# Patient Record
Sex: Male | Born: 1940
Health system: Southern US, Community
[De-identification: ages and names within clinical notes are randomized; demographics above are authoritative.]

## PROBLEM LIST (undated history)

## (undated) DIAGNOSIS — N200 Calculus of kidney: Secondary | ICD-10-CM

## (undated) DIAGNOSIS — E669 Obesity, unspecified: Secondary | ICD-10-CM

## (undated) DIAGNOSIS — I484 Atypical atrial flutter: Secondary | ICD-10-CM

## (undated) DIAGNOSIS — E785 Hyperlipidemia, unspecified: Secondary | ICD-10-CM

## (undated) DIAGNOSIS — I442 Atrioventricular block, complete: Secondary | ICD-10-CM

## (undated) DIAGNOSIS — I119 Hypertensive heart disease without heart failure: Secondary | ICD-10-CM

## (undated) DIAGNOSIS — I38 Endocarditis, valve unspecified: Secondary | ICD-10-CM

## (undated) HISTORY — DX: Hypertensive heart disease without heart failure: I11.9

## (undated) HISTORY — DX: Atrioventricular block, complete: I44.2

## (undated) HISTORY — DX: Obesity, unspecified: E66.9

## (undated) HISTORY — DX: Hyperlipidemia, unspecified: E78.5

## (undated) HISTORY — PX: AORTIC VALVE REPLACEMENT: SHX41

## (undated) HISTORY — DX: Atypical atrial flutter: I48.4

## (undated) HISTORY — DX: Endocarditis, valve unspecified: I38

## (undated) HISTORY — DX: Calculus of kidney: N20.0

## (undated) HISTORY — PX: AORTIC ROOT REPLACEMENT: SHX1178

---

## 1998-11-22 ENCOUNTER — Ambulatory Visit (HOSPITAL_COMMUNITY): Admission: RE | Admit: 1998-11-22 | Discharge: 1998-11-23 | Payer: Self-pay | Admitting: Otolaryngology

## 2001-07-21 ENCOUNTER — Encounter (INDEPENDENT_AMBULATORY_CARE_PROVIDER_SITE_OTHER): Payer: Self-pay | Admitting: Specialist

## 2001-07-21 ENCOUNTER — Other Ambulatory Visit: Admission: RE | Admit: 2001-07-21 | Discharge: 2001-07-21 | Payer: Self-pay | Admitting: Urology

## 2003-02-16 ENCOUNTER — Encounter (INDEPENDENT_AMBULATORY_CARE_PROVIDER_SITE_OTHER): Payer: Self-pay | Admitting: Specialist

## 2003-02-16 ENCOUNTER — Ambulatory Visit (HOSPITAL_COMMUNITY): Admission: RE | Admit: 2003-02-16 | Discharge: 2003-02-16 | Payer: Self-pay | Admitting: Gastroenterology

## 2006-01-30 ENCOUNTER — Ambulatory Visit (HOSPITAL_COMMUNITY): Admission: RE | Admit: 2006-01-30 | Discharge: 2006-01-30 | Payer: Self-pay | Admitting: Orthopedic Surgery

## 2007-03-22 HISTORY — PX: INSERT / REPLACE / REMOVE PACEMAKER: SUR710

## 2007-03-28 ENCOUNTER — Inpatient Hospital Stay (HOSPITAL_BASED_OUTPATIENT_CLINIC_OR_DEPARTMENT_OTHER): Admission: RE | Admit: 2007-03-28 | Discharge: 2007-03-28 | Payer: Self-pay | Admitting: Cardiology

## 2007-03-28 HISTORY — PX: CARDIAC CATHETERIZATION: SHX172

## 2007-05-05 ENCOUNTER — Encounter (HOSPITAL_COMMUNITY): Admission: RE | Admit: 2007-05-05 | Discharge: 2007-08-03 | Payer: Self-pay | Admitting: Cardiology

## 2007-08-04 ENCOUNTER — Encounter (HOSPITAL_COMMUNITY): Admission: RE | Admit: 2007-08-04 | Discharge: 2007-08-21 | Payer: Self-pay | Admitting: Cardiology

## 2009-02-15 ENCOUNTER — Encounter: Admission: RE | Admit: 2009-02-15 | Discharge: 2009-02-15 | Payer: Self-pay | Admitting: Family Medicine

## 2009-10-23 ENCOUNTER — Ambulatory Visit (HOSPITAL_COMMUNITY): Admission: RE | Admit: 2009-10-23 | Discharge: 2009-10-23 | Payer: Self-pay | Admitting: Surgery

## 2009-10-23 ENCOUNTER — Encounter (INDEPENDENT_AMBULATORY_CARE_PROVIDER_SITE_OTHER): Payer: Self-pay | Admitting: Surgery

## 2010-05-07 ENCOUNTER — Encounter: Payer: Self-pay | Admitting: Internal Medicine

## 2010-08-16 ENCOUNTER — Ambulatory Visit: Payer: Self-pay | Admitting: Cardiology

## 2010-10-18 ENCOUNTER — Encounter: Payer: Self-pay | Admitting: Internal Medicine

## 2010-11-11 ENCOUNTER — Ambulatory Visit: Payer: Self-pay | Admitting: Cardiology

## 2010-11-19 ENCOUNTER — Ambulatory Visit: Payer: Self-pay | Admitting: Internal Medicine

## 2010-11-19 DIAGNOSIS — E785 Hyperlipidemia, unspecified: Secondary | ICD-10-CM

## 2010-11-19 DIAGNOSIS — K219 Gastro-esophageal reflux disease without esophagitis: Secondary | ICD-10-CM

## 2010-11-19 DIAGNOSIS — I442 Atrioventricular block, complete: Secondary | ICD-10-CM

## 2010-11-19 DIAGNOSIS — I10 Essential (primary) hypertension: Secondary | ICD-10-CM

## 2011-01-20 NOTE — Assessment & Plan Note (Signed)
Summary: pacer check/medtronic   Visit Type:  Initial Consult Referring Lenay Lovejoy:  Dr Deborah Chalk   History of Present Illness: Scott [name] is a pleasant 70 yo WM with a s/p aortic valve replacement with AV block postoperatively requiring PPM who presents today to establish EP care.  He reports doing very well recently.  He remains very active.  He denies symptoms of palpitations, chest pain, shortness of breath, orthopnea, PND, lower extremity edema, dizziness, presyncope, syncope, or neurologic sequela. The patient is tolerating medications without difficulties and is otherwise without complaint today.   Current Medications (verified): 1)  Lipitor 10 Mg Tabs (Atorvastatin Calcium) .... Take One Tablet By Mouth Daily. 2)  Metoprolol Tartrate 25 Mg Tabs (Metoprolol Tartrate) .... Take One Tablet By Mouth Twice A Day 3)  Vitamin D 2000 Unit Tabs (Cholecalciferol) .... Daily 4)  Fish Oil   Oil (Fish Oil) .... 2 Daily 5)  Timolol Maleate .... Uad 6)  Multivitamins   Tabs (Multiple Vitamin) .... Once Daily  Allergies (verified): 1)  ! Morphine 2)  ! Cleocin  Past History:  Past Medical History: S/p aortic valve replacement complete heart block requiring PPM (MDT) implant 2008 VASECTOMY, HX OF (ICD-V26.52) HYPERTENSION (ICD-401.9) GERD (ICD-530.81) HYPERLIPIDEMIA (ICD-272.4)    Past Surgical History: sp aortic valve replacement by DR Park Breed at [facility] 2008 PPM (MDT) implanted at [facility] 2008  Family History: Reviewed history from 11/19/2010 and no changes required. Parents deceased.  Father died at 61 with heart attack  and hypertension.  Mother died at 24 with Alzheimer's.  Social History: Reviewed history from 11/19/2010 and no changes required.  He is retired. He has had no tobacco products for over  20 some years.  He lives at home with his wife.  Review of Systems       All systems are reviewed and negative except as listed in the HPI.   Vital  Signs:  Patient profile:   70 year old male Height:      70 inches Weight:      220 pounds BMI:     31.68 Pulse rate:   62 / minute BP sitting:   142 / 78  (left arm)  Vitals Entered By: Laurance Flatten CMA (November 19, 2010 2:43 PM)  Physical Exam  General:  Well developed, well nourished, in no acute distress. Head:  normocephalic and atraumatic Eyes:  PERRLA/EOM intact; conjunctiva and lids normal. Mouth:  Teeth, gums and palate normal. Oral mucosa normal. Neck:  supple Chest Wall:  pacemaker pocket is well healed Lungs:  Clear bilaterally to auscultation and percussion. Heart:  Non-displaced PMI, chest non-tender; regular rate and rhythm, S1, S2 without murmurs, rubs or gallops. Carotid upstroke normal, no bruit. Normal abdominal aortic size, no bruits. Femorals normal pulses, no bruits. Pedals normal pulses. No edema, no varicosities. Abdomen:  Bowel sounds positive; abdomen soft and non-tender without masses, organomegaly, or hernias noted. No hepatosplenomegaly. Msk:  Back normal, normal gait. Muscle strength and tone normal. Pulses:  pulses normal in all 4 extremities Extremities:  No clubbing or cyanosis. Neurologic:  Alert and oriented x 3.   PPM Specifications Following MD:  Scott Range, MD     Referring MD:  Rolla Plate PPM Vendor:  Medtronic     PPM Model Number:  [id number]     PPM Serial Number:  [id number] PPM DOI:  04/13/2007     PPM Implanting MD:  Scott Rhody Tennant,md  Lead 1    Location: RA  DOI: 04/13/2007     Model #: 0981     Serial #: [id number]     Status: active Lead 2    Location: RV     DOI: 04/13/2007     Model #: [id number]     Serial #: [id number]     Status: active  Magnet Response Rate:  BOL 85 ERI 65  Indications:  Sick sinus syndrome   PPM Follow Up Battery Voltage:  2.77 V     Battery Est. Longevity:  6.5 yrs       PPM Device Measurements Atrium  Amplitude: 2.80 mV, Impedance: 494 ohms, Threshold: 0.50 V at 0.40 msec Right Ventricle   Amplitude: 11.20 mV, Impedance: 734 ohms, Threshold: 0.750 V at 0.40 msec  Episodes MS Episodes:  0     Ventricular High Rate:  0     Atrial Pacing:  9.7%     Ventricular Pacing:  7.3%  Parameters Mode:  DDD     Lower Rate Limit:  60     Upper Rate Limit:  130 Paced AV Delay:  200     Sensed AV Delay:  180 Next Remote Date:  02/19/2011     Next Cardiology Appt Due:  10/22/2011 Tech Comments:  NORMAL DEVICE FUNCTION.  NO EPISODES SINCE LAST CHECK. NO CHANGES MADE. CARELINK 02-19-11 AND ROV IN 12 MTHS W/JA. Vella Kohler  November 19, 2010 3:18 PM MD Comments:  agree  Impression & Recommendations:  Problem # 1:  AV BLOCK, COMPLETE (ICD-426.0)  normal pacemaker function as above  His updated medication list for this problem includes:    Metoprolol Tartrate 25 Mg Tabs (Metoprolol tartrate) .Marland Kitchen... Take one tablet by mouth twice a day  Problem # 2:  HYPERTENSION (ICD-401.9)  stable no changes  His updated medication list for this problem includes:    Metoprolol Tartrate 25 Mg Tabs (Metoprolol tartrate) .Marland Kitchen... Take one tablet by mouth twice a day  Problem # 3:  HYPERLIPIDEMIA (ICD-272.4)  no changes  His updated medication list for this problem includes:    Lipitor 10 Mg Tabs (Atorvastatin calcium) .Marland Kitchen... Take one tablet by mouth daily.  Patient Instructions: 1)  Your physician wants you to follow-up in: 12 months with Dr Jacquiline Doe will receive a reminder letter in the mail two months in advance. If you don't receive a letter, please call our office to schedule the follow-up appointment. 2)  [facility] transmission on 02/19/2011

## 2011-01-20 NOTE — Cardiovascular Report (Signed)
Summary: Office Visit   Office Visit   Imported By: Roderic Ovens 11/25/2010 10:41:19  _____________________________________________________________________  External Attachment:    Type:   Image     Comment:   External Document

## 2011-01-20 NOTE — Miscellaneous (Signed)
Summary: Device preload  Clinical Lists Changes  Observations: Added new observation of PPM INDICATN: Sick sinus syndrome (10/18/2010 11:28) Added new observation of MAGNET RTE: BOL 85 ERI 65 (10/18/2010 11:28) Added new observation of PPMLEADSTAT2: active (10/18/2010 11:28) Added new observation of PPMLEADSER2: [id number] (10/18/2010 11:28) Added new observation of PPMLEADMOD2: 4092  (10/18/2010 11:28) Added new observation of PPMLEADDOI2: 04/13/2007  (10/18/2010 11:28) Added new observation of PPMLEADLOC2: RV  (10/18/2010 11:28) Added new observation of PPMLEADSTAT1: active  (10/18/2010 11:28) Added new observation of PPMLEADSER1: [id number]  (10/18/2010 11:28) Added new observation of PPMLEADMOD1: 5076  (10/18/2010 11:28) Added new observation of PPMLEADDOI1: 04/13/2007  (10/18/2010 11:28) Added new observation of PPMLEADLOC1: RA  (10/18/2010 11:28) Added new observation of PPM DOI: 04/13/2007  (10/18/2010 11:28) Added new observation of PPM SERL#: [id number]  (10/18/2010 11:28) Added new observation of PPM MODL#: [id number]  (10/18/2010 86:57) Added new observation of PACEMAKERMFG: Medtronic  (10/18/2010 11:28) Added new observation of PPM IMP MD: Duffy Rhody Tennant,md  (10/18/2010 11:28) Added new observation of PPM REFER MD: Rolla Plate  (10/18/2010 11:28) Added new observation of PACEMAKER MD: Hillis Range, MD  (10/18/2010 11:28)      PPM Specifications Following MD:  Hillis Range, MD     Referring MD:  Rolla Plate PPM Vendor:  Medtronic     PPM Model Number:  vedr01     PPM Serial Number:  [id number] PPM DOI:  04/13/2007     PPM Implanting MD:  Rolla Plate  Lead 1    Location: RA     DOI: 04/13/2007     Model #: 8413     Serial #: [id number]     Status: active Lead 2    Location: RV     DOI: 04/13/2007     Model #: 3664     Serial #: [id number]     Status: active  Magnet Response Rate:  BOL 85 ERI 65  Indications:  Sick sinus syndrome

## 2011-01-22 NOTE — Letter (Signed)
Summary: [facility] Progress Note   [facility] Progress Note   Imported By: Roderic Ovens 12/09/2010 16:16:45  _____________________________________________________________________  External Attachment:    Type:   Image     Comment:   External Document

## 2011-02-09 ENCOUNTER — Other Ambulatory Visit: Payer: Self-pay | Admitting: Family Medicine

## 2011-02-09 DIAGNOSIS — R410 Disorientation, unspecified: Secondary | ICD-10-CM

## 2011-02-10 ENCOUNTER — Ambulatory Visit
Admission: RE | Admit: 2011-02-10 | Discharge: 2011-02-10 | Disposition: A | Discharge status: Home / Self Care | Payer: Medicare Other | Source: Ambulatory Visit | Attending: Family Medicine | Admitting: Family Medicine

## 2011-02-10 ENCOUNTER — Other Ambulatory Visit: Payer: Self-pay

## 2011-02-10 ENCOUNTER — Other Ambulatory Visit: Payer: Self-pay | Admitting: Family Medicine

## 2011-02-10 DIAGNOSIS — R410 Disorientation, unspecified: Secondary | ICD-10-CM

## 2011-02-10 MED ORDER — IOHEXOL 300 MG/ML  SOLN
75.0000 mL | Freq: Once | INTRAMUSCULAR | Status: AC | PRN
  Administered 2011-02-10: 75 mL via INTRAVENOUS

## 2011-02-19 ENCOUNTER — Encounter: Payer: Self-pay | Admitting: Internal Medicine

## 2011-02-19 ENCOUNTER — Encounter (INDEPENDENT_AMBULATORY_CARE_PROVIDER_SITE_OTHER): Payer: Medicare Other

## 2011-02-19 DIAGNOSIS — I495 Sick sinus syndrome: Secondary | ICD-10-CM

## 2011-03-09 ENCOUNTER — Encounter: Payer: Self-pay | Admitting: *Deleted

## 2011-03-19 NOTE — Letter (Signed)
Summary: Remote Device Check  [employer], Main Office  [address]   [place], [place] [address]   Phone: [phone]  Fax: [phone]     March 09, 2011 MRN: [id number]   [place] Cocco [address] [place], [place]  [address]   Dear Mr. MCMICHAEL,   Your remote transmission was recieved and reviewed by your physician.  All diagnostics were within normal limits for you.  __X___Your next transmission is scheduled for:  05-21-2011.  Please transmit at any time this day.  If you have a wireless device your transmission will be sent automatically.  Sincerely,  Vella Kohler

## 2011-03-19 NOTE — Cardiovascular Report (Signed)
Summary: Office Visit   Office Visit   Imported By: Roderic Ovens 03/10/2011 14:10:55  _____________________________________________________________________  External Attachment:    Type:   Image     Comment:   External Document

## 2011-03-26 LAB — BASIC METABOLIC PANEL
CO2: 29 mEq/L (ref 19–32)
Calcium: 9.4 mg/dL (ref 8.4–10.5)
Creatinine, Ser: 1.01 mg/dL (ref 0.4–1.5)
GFR calc Af Amer: >60 mL/min (ref >60)
Glucose, Bld: 91 mg/dL (ref 70–99)

## 2011-03-26 LAB — DIFFERENTIAL
Basophils Absolute: 0 10*3/uL (ref 0.0–0.1)
Basophils Relative: 1 % (ref 0–1)
Eosinophils Relative: 6 % — ABNORMAL HIGH (ref 0–5)
Lymphocytes Relative: 24 % (ref 12–46)
Monocytes Absolute: 0.9 10*3/uL (ref 0.1–1.0)
Neutrophils Relative %: 58 % (ref 43–77)

## 2011-03-26 LAB — CBC
MCHC: 34.5 g/dL (ref 30.0–36.0)
MCV: 91.6 fL (ref 78.0–100.0)
WBC: 7 10*3/uL (ref 4.0–10.5)

## 2011-04-20 ENCOUNTER — Other Ambulatory Visit: Payer: Self-pay | Admitting: *Deleted

## 2011-04-20 DIAGNOSIS — E7800 Pure hypercholesterolemia, unspecified: Secondary | ICD-10-CM

## 2011-05-06 ENCOUNTER — Encounter: Payer: Self-pay | Admitting: Cardiology

## 2011-05-06 DIAGNOSIS — I7090 Unspecified atherosclerosis: Secondary | ICD-10-CM | POA: Insufficient documentation

## 2011-05-06 DIAGNOSIS — E785 Hyperlipidemia, unspecified: Secondary | ICD-10-CM | POA: Insufficient documentation

## 2011-05-06 DIAGNOSIS — E669 Obesity, unspecified: Secondary | ICD-10-CM | POA: Insufficient documentation

## 2011-05-06 DIAGNOSIS — N200 Calculus of kidney: Secondary | ICD-10-CM | POA: Insufficient documentation

## 2011-05-07 ENCOUNTER — Ambulatory Visit (INDEPENDENT_AMBULATORY_CARE_PROVIDER_SITE_OTHER): Payer: Medicare Other | Admitting: Cardiology

## 2011-05-07 ENCOUNTER — Encounter: Payer: Self-pay | Admitting: Cardiology

## 2011-05-07 ENCOUNTER — Other Ambulatory Visit (INDEPENDENT_AMBULATORY_CARE_PROVIDER_SITE_OTHER): Payer: Medicare Other | Admitting: *Deleted

## 2011-05-07 DIAGNOSIS — Z954 Presence of other heart-valve replacement: Secondary | ICD-10-CM

## 2011-05-07 DIAGNOSIS — I7090 Unspecified atherosclerosis: Secondary | ICD-10-CM

## 2011-05-07 DIAGNOSIS — Z952 Presence of prosthetic heart valve: Secondary | ICD-10-CM | POA: Insufficient documentation

## 2011-05-07 DIAGNOSIS — E7800 Pure hypercholesterolemia, unspecified: Secondary | ICD-10-CM

## 2011-05-07 DIAGNOSIS — I442 Atrioventricular block, complete: Secondary | ICD-10-CM

## 2011-05-07 LAB — HEPATIC FUNCTION PANEL
Albumin: 3.5 g/dL (ref 3.5–5.2)
Total Protein: 6 g/dL (ref 6.0–8.3)

## 2011-05-07 LAB — LIPID PANEL
Cholesterol: 170 mg/dL (ref 0–200)
HDL: 39.2 mg/dL (ref >39.00)
LDL Cholesterol: 100 mg/dL — ABNORMAL HIGH (ref 0–99)
Total CHOL/HDL Ratio: 4
Triglycerides: 155 mg/dL — ABNORMAL HIGH (ref 0.0–149.0)
VLDL: 31 mg/dL (ref 0.0–40.0)

## 2011-05-07 LAB — BASIC METABOLIC PANEL
BUN: 14 mg/dL (ref 6–23)
Sodium: 140 mEq/L (ref 135–145)

## 2011-05-07 NOTE — Progress Notes (Signed)
Subjective:   Scott [name] is seen today for a followup visit. He's had an aortic valve replacement and root revision in April 2008 with a #27 mm Medtronic FreeStyle bioprosthetic valve. He had a Medtronic dual-chamber pacemaker in April 2008 because of AV block. He has a history of hypercholesterolemia. Other problems have included mild obesity and only mild atherosclerosis at the time of catheterization in 2008.  Overall, Scott [name] is doing well with no specific complaints.  Current Outpatient Prescriptions  Medication Sig Dispense Refill  . atorvastatin (LIPITOR) 10 MG tablet Take 10 mg by mouth daily.        . Cholecalciferol (VITAMIN D PO) Take 200 Units by mouth daily.        . DiphenhydrAMINE HCl, Sleep, (SIMPLY SLEEP PO) Take by mouth at bedtime. 2 AT NIGHT       . dorzolamide-timolol (COSOPT) 22.3-6.8 MG/ML ophthalmic solution Place 1 drop into the right eye daily.        . fish oil-omega-3 fatty acids 1000 MG capsule Take by mouth daily. 2 DAILY       . metoprolol tartrate (LOPRESSOR) 25 MG tablet Take 25 mg by mouth 2 (two) times daily.        . ranitidine (ZANTAC) 150 MG tablet Take 150 mg by mouth at bedtime.        Marland Kitchen augmented betamethasone dipropionate (DIPROLENE-AF) 0.05 % cream as directed.        Allergies  Allergen Reactions  . Clindamycin/Lincomycin   . Morphine     Patient Active Problem List  Diagnoses  . HYPERLIPIDEMIA  . HYPERTENSION  . AV BLOCK, COMPLETE  . GERD  . Hyperlipidemia  . Obesity  . Atherosclerosis  . Kidney stones    History  Smoking status  . Former Smoker -- 1.5 packs/day for 30 years  . Types: Cigarettes  . Quit date: 10/30/1984  Smokeless tobacco  . Never Used    History  Alcohol Use No    Family History  Problem Relation Age of Onset  . Heart attack Father     Review of Systems:   The patient denies any heat or cold intolerance.  No weight gain or weight loss.  The patient denies headaches or blurry vision.  There is no cough or  sputum production.  The patient denies dizziness.  There is no hematuria or hematochezia.  The patient denies any muscle aches or arthritis.  The patient denies any rash.  The patient denies frequent falling or instability.  There is no history of depression or anxiety.  All other systems were reviewed and are negative.   Physical Exam:   Weight is 2:15. Blood pressure 124/84 sitting, heart rate is 68.The head is normocephalic and atraumatic.  Pupils are equally round and reactive to light.  Sclerae nonicteric.  Conjunctiva is clear.  Oropharynx is unremarkable.  There's adequate oral airway.  Neck is supple there are no masses.  Thyroid is not enlarged.  There is no lymphadenopathy.  Lungs are clear.  Chest is symmetric.  Heart shows a regular rate and rhythm.  S1 and S2 are normal.  There is no murmur click or gallop.  Abdomen is soft normal bowel sounds.  There is no organomegaly.  Genital and rectal deferred.  Extremities are without edema.  Peripheral pulses are adequate.  Neurologically intact.  Full range of motion.  The patient is not depressed.  Skin is warm and dry.  Assessment / Plan:

## 2011-05-07 NOTE — Assessment & Plan Note (Signed)
He has a tissue aortic valve. Overall, he's been doing well. He was placed because of aortic stenosis in the revision of his aortic root at the time of valve replacement.aortic valve surgery was done by Dr. Nevada Crane at [facility] in 2008.

## 2011-05-07 NOTE — Assessment & Plan Note (Signed)
Pacemaker followup will be done at the pacer clinic. Apparently he had an exceptionally long wait with his first visit.

## 2011-05-07 NOTE — Assessment & Plan Note (Signed)
He only had mild atherosclerosis at the time of catheterization in 2008 before his aortic valve replacement.

## 2011-05-08 ENCOUNTER — Telehealth: Payer: Self-pay | Admitting: *Deleted

## 2011-05-08 NOTE — Telephone Encounter (Signed)
Pt notified of lab results and to continue to work on diet/ exercise.  Pt will have labs in six months with Dr. Catha Gosselin.  Labs faxed to Dr. Catha Gosselin.

## 2011-05-08 NOTE — Op Note (Signed)
   NAME:  Scott [name], Scott [name]                          ACCOUNT NO.:  [id number]   MEDICAL RECORD NO.:  [id number]                   PATIENT TYPE:  AMB   LOCATION:  ENDO                                 FACILITY:  [facility]   PHYSICIAN:  John C. Madilyn Fireman, M.D.                 DATE OF BIRTH:  November 03, 1941   DATE OF PROCEDURE:  02/16/2003  DATE OF DISCHARGE:                                 OPERATIVE REPORT   PROCEDURE:  Colonoscopy with polypectomy.   INDICATIONS FOR PROCEDURE:  Colon cancer screening in a 70 year old patient.   DESCRIPTION OF PROCEDURE:  The patient was placed in the left lateral  decubitus position then placed on the pulse monitor with continuous low flow  oxygen delivered by nasal cannula. He was sedated with 100 mcg IV fentanyl  and 8 mg IV Versed. The Olympus video colonoscope was inserted into the  rectum and advanced to the cecum, confirmed by transillumination at  McBurney's point and visualization of the ileocecal valve and appendiceal  orifice. The prep was excellent. The cecum appeared normal. Within the  ascending colon, there was seen an 8 mm sessile polyp fulgurated by hot  biopsy. Similar polyps were seen in the transverse and descending colon and  also hot biopsied. These areas were otherwise normal as was the sigmoid  colon, no diverticula were noted. The rectum appeared normal and retroflexed  view of the anus revealed no obvious internal hemorrhoids. The colonoscope  was then withdrawn and the patient returned to the recovery room in stable  condition. The patient tolerated the procedure well and there were no  immediate complications.   IMPRESSION:  Ascending, transverse and descending colon polyps.   PLAN:  Await histology to determine method and interval for future colon  screening.                                               John C. Madilyn Fireman, M.D.    JCH/MEDQ  D:  02/16/2003  T:  02/16/2003  Job:  [id number]   cc:   Caryn Bee L. Little, M.D.  [address]  [place]  [place] [address]  Fax: [phone]

## 2011-05-08 NOTE — Cardiovascular Report (Signed)
NAME:  WINFERD, WEASE NO.:  [id number]   MEDICAL RECORD NO.:  [id number]          PATIENT TYPE:  OIB   LOCATION:  1962                         FACILITY:  [facility]   PHYSICIAN:  Colleen Can. Deborah Chalk, M.D.DATE OF BIRTH:  [birth year]   DATE OF PROCEDURE:  03/28/2007  DATE OF DISCHARGE:                            CARDIAC CATHETERIZATION   PROCEDURES:  1. Right left heart catheterization with selective coronary      angiography.  2. Left ventricular angiography.  3. Aortic root angiography.   HISTORY:  Mr. Navarrete is a 70 year old male referred for evaluation of  aortic stenosis.  He has had progressive increase in his aortic valve  gradients over the last 5 years.  In 2003, he had mean aortic valve  gradient of 19 mmHg with a peak gradient of 34 mm.  In 2004, his peak  gradient was 43 mmHg.  In 2006 his peak gradient was 87 mmHg; and in  2007 his peak aortic valve gradient was 91 mmHg with a mean of 53 mmHg.  The aortic valve was severely calcified.  He is moderately active; and  it was felt this appropriate to proceed on with elective aortic valve  surgery at this point in time.   PROCEDURE:  Is left to right heart catheterization with selective  coronary angiography, left ventricular angiography with aortic root  angiography, type and site of  percutaneous right femoral artery,  percutaneous right femoral vein.   CATHETERS:  A 7-French Swan-Ganz thermodilution catheter, a 5-French, 4  curved Judkins right-and-left coronary catheters, 5-French pigtail  ventriculographic catheter.   CONTRAST:  Pure Omnipaque.   MEDICATIONS GIVEN PRIOR TO PROCEDURE:  Valium 10 mg.   MEDICATIONS GIVEN DURING PROCEDURE:  Versed 2 mg IV.   COMMENT:  The patient tolerated the procedure well.   HEMODYNAMIC DATA:  The right atrial pressure showed an A wave of 11, V  wave of 8, and mean of 6.  RV was 38/3-7.  PA was 36/13-23.  Pulmonary  capillary wedge pressure:  A wave of 12, V wave  of 10, mean of 8.  Aortic pressure was 130/77, LV was 173/10-16.  On pullback the aortic  pressure was 118/68, LV was 170/11-17.  The cardiac output was 5.1 by  Fick and 5.0 by thermodilution.  Fick cardiac index was 2.38 and  thermodilution cardiac output was 2.34.  Aortic valve area by Fick was  0.81 with aortic valve index of 0.38.   ANGIOGRAPHIC DATA:  The left ventricular angiogram was performed in RAO  position.  Overall cardiac size and silhouette were normal.  Global  function and regional wall motion were normal.   CORONARY ARTERIES:  1. LEFT MAIN CORONARY ARTERY:  Left main coronary artery had some mild      distal narrowing.  2. LEFT ANTERIOR DESCENDING:  Left anterior descending had      irregularities.  It ended on anterior wall.  There is a moderate-      sized diagonal in its midportion.  3. INTERMEDIATE CORONARY:  The intermediate coronary bifurcated early.      It  had irregularities, but otherwise was unremarkable.  4. LEFT CIRCUMFLEX:  The left circumflex is a small vessel.  It had      irregularities.  5. RIGHT CORONARY ARTERY:  The right coronary artery is a large      vessel.  It is calcified proximally.  It is mildly ectatic between      the acute margin and the crux.  It had no significant focal      disease.  6. AORTIC ROOT:  Demonstrated no significant aortic insufficiency.   OVERALL IMPRESSION:  1. Severe aortic stenosis.  2. Mild coronary atherosclerosis.   DISCUSSION:  In light of these findings and the progressive nature of  his gradient on echocardiogram as well as calcification, Merdith will be  referred for aortic valve replacement.      Colleen Can. Deborah Chalk, M.D.  Electronically Signed     SNT/MEDQ  D:  03/28/2007  T:  03/28/2007  Job:  [id number]

## 2011-05-08 NOTE — Telephone Encounter (Signed)
Message copied by Barnetta Hammersmith on Fri May 08, 2011  9:13 AM ------      Message from: Roger Shelter      Created: Fri May 08, 2011  8:53 AM       Ok, continue to work on diet/exercise.  Recheck labs in six months.

## 2011-05-08 NOTE — H&P (Signed)
NAME:  MONTREL, DONAHOE NO.:  [id number]   MEDICAL RECORD NO.:  [id number]           PATIENT TYPE:   LOCATION:                                 FACILITY:   PHYSICIAN:  Colleen Can. Deborah Chalk, M.D.DATE OF BIRTH:  13-Aug-1941   DATE OF ADMISSION:  DATE OF DISCHARGE:                              HISTORY & PHYSICAL   CHIEF COMPLAINT:  None.   HISTORY OF PRESENT ILLNESS:  Randell is a very pleasant 70 year old white  male who has known aortic stenosis.  He has been relatively  asymptomatic.  He has had progression of his aortic stenosis and is now  referred for elective cardiac catheterization.   PAST MEDICAL HISTORY.:  1. Severe asymptomatic aortic stenosis.  His most recent 2-D      echocardiogram showed an ejection fraction of 55-60% with aortic      valve area of 0.52 cm square,  mean gradient 53, peak gradient of      91.  2. Hyperlipidemia.  3. Gastroesophageal reflux disease.  4. Hypertension.  5. Previous left eye surgery.  He is blind in the left eye.  6. Previous history of oral surgery for sleep apnea in 2002.  7. Remote vasectomy.  8. Remote history of kidney stones.   ALLERGIES:  NONE.   CURRENT MEDICATIONS:  1. Lipitor 10 mg a day.  2. Pepcid 20 mg a day.  3. Baby aspirin daily.   FAMILY HISTORY:  Parents deceased.  Father died at 59 with heart attack  and hypertension.  Mother died at 66 with Alzheimer's.   SOCIAL HISTORY:  He is retired. He has had no tobacco products for over  20 some years.  He lives at home with his wife.   REVIEW OF SYSTEMS:  As noted above.  He remains moderately active.  He  has had no chest pain, shortness of breath or syncope.   PHYSICAL EXAMINATION:  On exam he is a pleasant white male in no acute  distress.  His weight 209, blood pressure is elevated at 160/100  sitting, 140/90 standing, heart rate 62 and regular, respirations 18.  He is afebrile.  SKIN:  Warm, dry.  Color is unremarkable.  LUNGS: Clear.  HEART:  Shows a harsh grade 3/6 outflow murmur. S2 is well-preserved.  There was no S3.  ABDOMEN: Soft.  EXTREMITIES: Without edema.  NEURO:  shows no gross focal deficits.   Pertinent labs are pending.   OVERALL IMPRESSION:  1. Severe asymptomatic aortic stenosis.  2. Hyperlipidemia.  3. Elevated blood pressure was known mild LVH.   PLAN:  Will proceed on with left and right heart catheterization.  Procedure has been reviewed in full detail and he is willing to proceed  on Monday March 28, 2007.      Sharlee Blew, N.P.      Colleen Can. Deborah Chalk, M.D.  Electronically Signed    LC/MEDQ  D:  03/25/2007  T:  03/25/2007  Job:  [id number]   cc:   Caryn Bee L. Little, M.D.

## 2011-05-21 ENCOUNTER — Encounter: Payer: Medicare Other | Admitting: *Deleted

## 2011-05-23 ENCOUNTER — Encounter: Payer: Self-pay | Admitting: *Deleted

## 2011-05-26 ENCOUNTER — Other Ambulatory Visit: Payer: Self-pay | Admitting: Internal Medicine

## 2011-05-26 ENCOUNTER — Ambulatory Visit (INDEPENDENT_AMBULATORY_CARE_PROVIDER_SITE_OTHER): Payer: Medicare Other | Admitting: *Deleted

## 2011-05-26 DIAGNOSIS — I442 Atrioventricular block, complete: Secondary | ICD-10-CM

## 2011-05-27 ENCOUNTER — Other Ambulatory Visit: Payer: Self-pay | Admitting: Dermatology

## 2011-06-03 NOTE — Progress Notes (Signed)
Pacer remote check  

## 2011-06-11 ENCOUNTER — Encounter: Payer: Self-pay | Admitting: *Deleted

## 2011-09-01 ENCOUNTER — Other Ambulatory Visit: Payer: Self-pay | Admitting: Dermatology

## 2011-09-09 ENCOUNTER — Telehealth: Payer: Self-pay | Admitting: Nurse Practitioner

## 2011-09-09 NOTE — Telephone Encounter (Signed)
Dentist office is calling to see if patient needs to pre-med before having a cleaning.  Gwen states the pt had a valve replacement in 2008 and was seen here for heart mumurs.  She states they forgot to contact us to check prior to his appt there which is at 10am today.  Please call office back to discuss.  Chart is in box.

## 2011-09-09 NOTE — Telephone Encounter (Signed)
Dr. Elder Cyphers office called wanted to know if Scott [name] needed to be pre medicated prior to dental app this AM. Per Lawson Fiscal does need SBE- has aortic valve. Gwen at Dr. Yetta Barre office said they did pre medicate him just in case.

## 2011-09-23 ENCOUNTER — Telehealth: Payer: Self-pay | Admitting: Internal Medicine

## 2011-09-23 NOTE — Telephone Encounter (Signed)
Walk In Pt Form " Pt Dropped off Refill Prescription" sent to Message Nurse  09/23/11/km

## 2011-09-24 ENCOUNTER — Other Ambulatory Visit: Payer: Self-pay | Admitting: *Deleted

## 2011-09-24 DIAGNOSIS — I10 Essential (primary) hypertension: Secondary | ICD-10-CM

## 2011-09-24 DIAGNOSIS — E7800 Pure hypercholesterolemia, unspecified: Secondary | ICD-10-CM

## 2011-09-24 MED ORDER — METOPROLOL TARTRATE 25 MG PO TABS: 25.0000 mg | ORAL_TABLET | Freq: Two times a day (BID) | ORAL | Status: DC

## 2011-09-24 MED ORDER — ATORVASTATIN CALCIUM 10 MG PO TABS: 10.0000 mg | ORAL_TABLET | Freq: Every day | ORAL | Status: DC

## 2011-09-28 ENCOUNTER — Telehealth: Payer: Self-pay | Admitting: Internal Medicine

## 2011-09-28 NOTE — Telephone Encounter (Signed)
Pt  Called. Pt said he brought in a refill request for metoprolol  180 tablets and atorvastatin  90 tablets for  [facility] and it was called into [facility] by mistake. Please call asap. He doesn't know what to do now

## 2011-09-28 NOTE — Telephone Encounter (Signed)
Pt called back and I let him know that I sent in his Rx's to [facility] on 09/24/11

## 2011-11-30 ENCOUNTER — Encounter: Payer: Self-pay | Admitting: Internal Medicine

## 2011-11-30 ENCOUNTER — Ambulatory Visit (INDEPENDENT_AMBULATORY_CARE_PROVIDER_SITE_OTHER): Payer: Medicare Other | Admitting: Internal Medicine

## 2011-11-30 DIAGNOSIS — R5383 Other fatigue: Secondary | ICD-10-CM

## 2011-11-30 DIAGNOSIS — I442 Atrioventricular block, complete: Secondary | ICD-10-CM

## 2011-11-30 DIAGNOSIS — Z952 Presence of prosthetic heart valve: Secondary | ICD-10-CM

## 2011-11-30 DIAGNOSIS — I359 Nonrheumatic aortic valve disorder, unspecified: Secondary | ICD-10-CM

## 2011-11-30 DIAGNOSIS — R5381 Other malaise: Secondary | ICD-10-CM

## 2011-11-30 DIAGNOSIS — I10 Essential (primary) hypertension: Secondary | ICD-10-CM

## 2011-11-30 LAB — PACEMAKER DEVICE OBSERVATION
AL AMPLITUDE: 2.8 mv
BAMS-0001: 175 {beats}/min
RV LEAD AMPLITUDE: 15.68 mv
RV LEAD IMPEDENCE PM: 827 Ohm
RV LEAD THRESHOLD: 0.5 V
VENTRICULAR PACING PM: 17

## 2011-11-30 NOTE — Assessment & Plan Note (Signed)
Stable No change required today  

## 2011-11-30 NOTE — Assessment & Plan Note (Signed)
He is scheduled to see Dr Shirlee Latch

## 2011-11-30 NOTE — Patient Instructions (Signed)
Your physician has recommended that you have a sleep study. This test records several body functions during sleep, including: brain activity, eye movement, oxygen and carbon dioxide blood levels, heart rate and rhythm, breathing rate and rhythm, the flow of air through your mouth and nose, snoring, body muscle movements, and chest and belly movement.  Your physician wants you to follow-up in: 1 year with Dr. Johney Frame.   You will receive a reminder letter in the mail two months in advance. If you don't receive a letter, please call our office to schedule the follow-up appointment.   Your physician recommends that you continue on your current medications as directed. Please refer to the Current Medication list given to you today.

## 2011-11-30 NOTE — Progress Notes (Signed)
PCP:  Mickie Hillier, MD  The patient presents today for routine electrophysiology followup.  Since last being seen in our clinic, the patient reports doing very well.  Today, he denies symptoms of palpitations, chest pain, shortness of breath, orthopnea, PND, lower extremity edema, dizziness, presyncope, syncope, or neurologic sequela.  The patient feels that he is tolerating medications without difficulties and is otherwise without complaint today.   Past Medical History  Diagnosis Date  . Hyperlipidemia   . Obesity   . Atherosclerosis   . Kidney stones   . Complete heart block     s/p PPM following AVR   Past Surgical History  Procedure Date  . Cardiac catheterization 03/28/2007    OVERALL CARDIAC SIZE AND SILHOUETTE WERE NORMAL. GLOBAL FUNCTION AND REGIONAL WALL WERE NORMAL  . Aortic valve replacement   . Aortic root replacement   . Insert / replace / remove pacemaker 03/2007    Current Outpatient Prescriptions  Medication Sig Dispense Refill  . atorvastatin (LIPITOR) 10 MG tablet Take 1 tablet (10 mg total) by mouth daily.  90 tablet  3  . augmented betamethasone dipropionate (DIPROLENE-AF) 0.05 % cream as directed.      . Cholecalciferol (VITAMIN D PO) Take 200 Units by mouth daily.        . DiphenhydrAMINE HCl, Sleep, (SIMPLY SLEEP PO) Take by mouth at bedtime. 2 AT NIGHT       . dorzolamide-timolol (COSOPT) 22.3-6.8 MG/ML ophthalmic solution Place 1 drop into the right eye daily.        . fish oil-omega-3 fatty acids 1000 MG capsule Take by mouth daily. 2 DAILY       . metoprolol tartrate (LOPRESSOR) 25 MG tablet Take 1 tablet (25 mg total) by mouth 2 (two) times daily.  180 tablet  3  . ranitidine (ZANTAC) 150 MG tablet Take 150 mg by mouth at bedtime.        . timolol (TIMOPTIC) 0.5 % ophthalmic solution as directed.        Allergies  Allergen Reactions  . Clindamycin/Lincomycin   . Morphine     History   Social History  . Marital Status: Married    Spouse  Name: N/A    Number of Children: N/A  . Years of Education: N/A   Occupational History  . Not on file.   Social History Main Topics  . Smoking status: Former Smoker -- 1.5 packs/day for 30 years    Types: Cigarettes    Quit date: 10/30/1984  . Smokeless tobacco: Never Used  . Alcohol Use: No  . Drug Use: No  . Sexually Active: Not on file   Other Topics Concern  . Not on file   Social History Narrative  . No narrative on file    Family History  Problem Relation Age of Onset  . Heart attack Father     ROS-  All systems are reviewed and are negative except as outlined in the HPI above   Physical Exam: Filed Vitals:   11/30/11 1013  BP: 124/68  Pulse: 64  Height: 5\' 10"  (1.778 m)  Weight: 216 lb (97.977 kg)    GEN- The patient is well appearing, alert and oriented x 3 today.   Head- normocephalic, atraumatic Eyes-  Sclera clear, conjunctiva pink Ears- hearing intact Oropharynx- clear Neck- supple, no JVP Lymph- no cervical lymphadenopathy Lungs- Clear to ausculation bilaterally, normal work of breathing Chest- pacemaker pocket is well healed Heart- Regular rate and rhythm, no murmurs, rubs  or gallops, PMI not laterally displaced GI- soft, NT, ND, + BS Extremities- no clubbing, cyanosis, or edema  Pacemaker interrogation- reviewed in detail today,  See PACEART report  Assessment and Plan:

## 2011-11-30 NOTE — Assessment & Plan Note (Signed)
Normal pacemaker function See Pace Art report No changes today  

## 2011-12-23 DIAGNOSIS — E291 Testicular hypofunction: Secondary | ICD-10-CM | POA: Diagnosis not present

## 2012-01-04 DIAGNOSIS — E291 Testicular hypofunction: Secondary | ICD-10-CM | POA: Diagnosis not present

## 2012-01-14 ENCOUNTER — Encounter: Payer: Self-pay | Admitting: Cardiology

## 2012-01-14 ENCOUNTER — Encounter (HOSPITAL_BASED_OUTPATIENT_CLINIC_OR_DEPARTMENT_OTHER): Payer: Medicare Other

## 2012-01-14 ENCOUNTER — Ambulatory Visit (INDEPENDENT_AMBULATORY_CARE_PROVIDER_SITE_OTHER): Payer: Medicare Other | Admitting: Cardiology

## 2012-01-14 DIAGNOSIS — I359 Nonrheumatic aortic valve disorder, unspecified: Secondary | ICD-10-CM

## 2012-01-14 DIAGNOSIS — Z954 Presence of other heart-valve replacement: Secondary | ICD-10-CM | POA: Diagnosis not present

## 2012-01-14 DIAGNOSIS — G4730 Sleep apnea, unspecified: Secondary | ICD-10-CM | POA: Diagnosis not present

## 2012-01-14 DIAGNOSIS — I442 Atrioventricular block, complete: Secondary | ICD-10-CM

## 2012-01-14 DIAGNOSIS — E785 Hyperlipidemia, unspecified: Secondary | ICD-10-CM

## 2012-01-14 DIAGNOSIS — I7090 Unspecified atherosclerosis: Secondary | ICD-10-CM

## 2012-01-14 DIAGNOSIS — Z952 Presence of prosthetic heart valve: Secondary | ICD-10-CM

## 2012-01-14 DIAGNOSIS — G4733 Obstructive sleep apnea (adult) (pediatric): Secondary | ICD-10-CM

## 2012-01-14 LAB — BASIC METABOLIC PANEL
BUN: 13 mg/dL (ref 6–23)
CO2: 27 mEq/L (ref 19–32)
Calcium: 9 mg/dL (ref 8.4–10.5)
GFR: 70.49 mL/min (ref >60.00)
Glucose, Bld: 101 mg/dL — ABNORMAL HIGH (ref 70–99)

## 2012-01-14 LAB — HEPATIC FUNCTION PANEL
ALT: 25 U/L (ref 0–53)
AST: 22 U/L (ref 0–37)
Alkaline Phosphatase: 70 U/L (ref 39–117)
Bilirubin, Direct: 0.1 mg/dL (ref 0.0–0.3)
Total Bilirubin: 0.9 mg/dL (ref 0.3–1.2)

## 2012-01-14 LAB — LIPID PANEL: Total CHOL/HDL Ratio: 4

## 2012-01-14 NOTE — Assessment & Plan Note (Signed)
Has Medtronic dual chamber PCM with regular followup.  He is not pacing today.

## 2012-01-14 NOTE — Patient Instructions (Signed)
Take aspirin 81mg  daily.  Your physician recommends that you have  lab work today--lipid profile/liver profile/BMET   Your physician has requested that you have an echocardiogram. Echocardiography is a painless test that uses sound waves to create images of your heart. It provides your doctor with information about the size and shape of your heart and how well your heart's chambers and valves are working. This procedure takes approximately one hour. There are no restrictions for this procedure.  Your physician has recommended that you have a sleep study. This test records several body functions during sleep, including: brain activity, eye movement, oxygen and carbon dioxide blood levels, heart rate and rhythm, breathing rate and rhythm, the flow of air through your mouth and nose, snoring, body muscle movements, and chest and belly movement.  Your physician wants you to follow-up in: 6 months with Dr Shirlee Latch. (July 2013) You will receive a reminder letter in the mail two months in advance. If you don't receive a letter, please call our office to schedule the follow-up appointment.

## 2012-01-14 NOTE — Assessment & Plan Note (Signed)
History of OSA, had uvuloplasty in the past.  Still with OSA symptoms and not on CPAP.  Will arrange sleep study.

## 2012-01-14 NOTE — Progress Notes (Signed)
PCP: Dr. Caryn Bee LIttle  71 yo with history of aortic valve replacement (bioprosthetic valve) and aortic root revision in 4/08 presents for cardiology followup.  He has been seen by Dr. Deborah Chalk in the past and is seen by me for the first time today.  He had a Medtronic dual chamber PCM placed post-operatively in 4/08 for complete heart block. He is not paced on ECG today.  He has done well since his surgery.  No chest pain.  He walks for exercise and denies exertional dyspnea.  He is not taking aspirin.  He also reports significant daytime sleepiness and snoring.  He had a uvuloplasty over 10 years ago.   ECG: NSR, RBBB  PMH: 1. OSA: s/p uvuloplasty > 10 years ago 2. S/p post bioprosthetic aortic valve replacement with #27 Medtronic FreeStyle valve in 4/08.  Also had aortic root revision.  3. Post-op complete heart block with Medtronic dual chamber PCM (4/08). 4. Hyperlipidemia 5. GERD 6. Cath pre-op in 2008 with mild atherosclerosis.  7. HTN  SH: Lives in [place], married, retired Careers adviser.  Quit smoking in 1985.    FH: Father with MI in his late 61s.  ROS: All systems reviewed and negative except as per HPI.   Current Outpatient Prescriptions  Medication Sig Dispense Refill  . atorvastatin (LIPITOR) 10 MG tablet Take 1 tablet (10 mg total) by mouth daily.  90 tablet  3  . augmented betamethasone dipropionate (DIPROLENE-AF) 0.05 % cream as directed.      . Cholecalciferol (VITAMIN D PO) Take 200 Units by mouth daily.        . DiphenhydrAMINE HCl, Sleep, (SIMPLY SLEEP PO) Take by mouth at bedtime. 2 AT NIGHT       . fish oil-omega-3 fatty acids 1000 MG capsule Take by mouth daily. 2 DAILY       . metoprolol tartrate (LOPRESSOR) 25 MG tablet Take 1 tablet (25 mg total) by mouth 2 (two) times daily.  180 tablet  3  . ranitidine (ZANTAC) 150 MG tablet Take 150 mg by mouth at bedtime.        . timolol (TIMOPTIC) 0.5 % ophthalmic solution as directed.      Marland Kitchen aspirin EC 81 MG tablet Take  1 tablet (81 mg total) by mouth daily.        BP 130/74  Pulse 68  Ht 5\' 10"  (1.778 m)  Wt 98.884 kg (218 lb)  BMI 31.28 kg/m2 General: NAD, overweight Neck: Thick, no JVD, no thyromegaly or thyroid nodule.  Lungs: Clear to auscultation bilaterally with normal respiratory effort. CV: Nondisplaced PMI.  Heart regular S1/S2, no S3/S4, no murmur.  No peripheral edema.  No carotid bruit.  Normal pedal pulses.  Abdomen: Soft, nontender, no hepatosplenomegaly, no distention.  Neurologic: Alert and oriented x 3.  Psych: Normal affect. Extremities: No clubbing or cyanosis.

## 2012-01-14 NOTE — Assessment & Plan Note (Signed)
Bioprosthetic aortic valve with aortic root revision in 4/08.  He is doing well symptomatically.  He needs antibiotics with dental work.  No imaging of valve in several years, so will arrange echo to assess valve function.  He needs to start ASA 81 mg daily.

## 2012-01-14 NOTE — Assessment & Plan Note (Signed)
No recent lipids.  He is fasting, will check today.

## 2012-01-20 DIAGNOSIS — E291 Testicular hypofunction: Secondary | ICD-10-CM | POA: Diagnosis not present

## 2012-01-21 ENCOUNTER — Ambulatory Visit (HOSPITAL_COMMUNITY): Payer: Medicare Other | Attending: Cardiovascular Disease

## 2012-01-21 DIAGNOSIS — I359 Nonrheumatic aortic valve disorder, unspecified: Secondary | ICD-10-CM | POA: Diagnosis not present

## 2012-01-21 DIAGNOSIS — Z954 Presence of other heart-valve replacement: Secondary | ICD-10-CM | POA: Diagnosis not present

## 2012-01-21 DIAGNOSIS — E785 Hyperlipidemia, unspecified: Secondary | ICD-10-CM | POA: Diagnosis not present

## 2012-01-25 ENCOUNTER — Telehealth: Payer: Self-pay | Admitting: Cardiology

## 2012-01-25 NOTE — Telephone Encounter (Signed)
Talked with pt about recent echo results 

## 2012-01-25 NOTE — Telephone Encounter (Signed)
Fu call °Patient returning your call °

## 2012-01-25 NOTE — Telephone Encounter (Signed)
Fu call °Patient returning your call again °

## 2012-01-25 NOTE — Telephone Encounter (Signed)
LMTCB

## 2012-02-01 ENCOUNTER — Ambulatory Visit (HOSPITAL_BASED_OUTPATIENT_CLINIC_OR_DEPARTMENT_OTHER): Payer: Medicare Other

## 2012-02-15 DIAGNOSIS — E291 Testicular hypofunction: Secondary | ICD-10-CM | POA: Diagnosis not present

## 2012-02-18 DIAGNOSIS — N400 Enlarged prostate without lower urinary tract symptoms: Secondary | ICD-10-CM | POA: Diagnosis not present

## 2012-02-18 DIAGNOSIS — E291 Testicular hypofunction: Secondary | ICD-10-CM | POA: Diagnosis not present

## 2012-03-01 DIAGNOSIS — L821 Other seborrheic keratosis: Secondary | ICD-10-CM | POA: Diagnosis not present

## 2012-03-01 DIAGNOSIS — L408 Other psoriasis: Secondary | ICD-10-CM | POA: Diagnosis not present

## 2012-03-01 DIAGNOSIS — L570 Actinic keratosis: Secondary | ICD-10-CM | POA: Diagnosis not present

## 2012-03-02 DIAGNOSIS — E291 Testicular hypofunction: Secondary | ICD-10-CM | POA: Diagnosis not present

## 2012-03-03 ENCOUNTER — Encounter: Payer: Medicare Other | Admitting: *Deleted

## 2012-03-07 ENCOUNTER — Encounter: Payer: Self-pay | Admitting: *Deleted

## 2012-03-11 ENCOUNTER — Ambulatory Visit (INDEPENDENT_AMBULATORY_CARE_PROVIDER_SITE_OTHER): Payer: Medicare Other | Admitting: *Deleted

## 2012-03-11 DIAGNOSIS — I442 Atrioventricular block, complete: Secondary | ICD-10-CM

## 2012-03-12 ENCOUNTER — Encounter: Payer: Self-pay | Admitting: Internal Medicine

## 2012-03-14 LAB — REMOTE PACEMAKER DEVICE
AL AMPLITUDE: 2.8 mv
ATRIAL PACING PM: 14
BAMS-0001: 175 {beats}/min
BATTERY VOLTAGE: 2.76 V
RV LEAD IMPEDENCE PM: 861 Ohm
VENTRICULAR PACING PM: 24

## 2012-03-17 DIAGNOSIS — H409 Unspecified glaucoma: Secondary | ICD-10-CM | POA: Diagnosis not present

## 2012-03-17 DIAGNOSIS — H4011X0 Primary open-angle glaucoma, stage unspecified: Secondary | ICD-10-CM | POA: Diagnosis not present

## 2012-03-21 ENCOUNTER — Encounter: Payer: Self-pay | Admitting: *Deleted

## 2012-03-22 NOTE — Progress Notes (Signed)
Remote pacer check  

## 2012-03-23 DIAGNOSIS — E291 Testicular hypofunction: Secondary | ICD-10-CM | POA: Diagnosis not present

## 2012-04-20 DIAGNOSIS — E291 Testicular hypofunction: Secondary | ICD-10-CM | POA: Diagnosis not present

## 2012-05-18 DIAGNOSIS — E291 Testicular hypofunction: Secondary | ICD-10-CM | POA: Diagnosis not present

## 2012-06-15 DIAGNOSIS — E291 Testicular hypofunction: Secondary | ICD-10-CM | POA: Diagnosis not present

## 2012-06-16 ENCOUNTER — Encounter: Payer: Medicare Other | Admitting: *Deleted

## 2012-06-20 ENCOUNTER — Encounter: Payer: Self-pay | Admitting: *Deleted

## 2012-06-25 ENCOUNTER — Encounter: Payer: Self-pay | Admitting: Internal Medicine

## 2012-06-27 ENCOUNTER — Ambulatory Visit (INDEPENDENT_AMBULATORY_CARE_PROVIDER_SITE_OTHER): Payer: Medicare Other | Admitting: *Deleted

## 2012-06-27 DIAGNOSIS — I442 Atrioventricular block, complete: Secondary | ICD-10-CM | POA: Diagnosis not present

## 2012-06-27 LAB — REMOTE PACEMAKER DEVICE
AL IMPEDENCE PM: 527 Ohm
AL THRESHOLD: 0.875 V
ATRIAL PACING PM: 18
RV LEAD AMPLITUDE: 11.2 mv
RV LEAD IMPEDENCE PM: 775 Ohm
RV LEAD THRESHOLD: 0.75 V

## 2012-06-30 ENCOUNTER — Encounter: Payer: Self-pay | Admitting: Cardiology

## 2012-06-30 ENCOUNTER — Ambulatory Visit (INDEPENDENT_AMBULATORY_CARE_PROVIDER_SITE_OTHER): Payer: Medicare Other | Admitting: Cardiology

## 2012-06-30 VITALS — BP 126/72 | HR 60 | Ht 70.0 in | Wt 217.0 lb

## 2012-06-30 DIAGNOSIS — E785 Hyperlipidemia, unspecified: Secondary | ICD-10-CM | POA: Diagnosis not present

## 2012-06-30 DIAGNOSIS — I359 Nonrheumatic aortic valve disorder, unspecified: Secondary | ICD-10-CM | POA: Diagnosis not present

## 2012-06-30 DIAGNOSIS — Z954 Presence of other heart-valve replacement: Secondary | ICD-10-CM

## 2012-06-30 DIAGNOSIS — I442 Atrioventricular block, complete: Secondary | ICD-10-CM | POA: Diagnosis not present

## 2012-06-30 DIAGNOSIS — Z952 Presence of prosthetic heart valve: Secondary | ICD-10-CM

## 2012-06-30 NOTE — Patient Instructions (Addendum)
Your physician recommends that you return for lab work on January 7,2014--fasting lipid profile/BMET. You should not eat or drink after midnight the night before the lab. The lab opens at 8:30am.   Your physician wants you to follow-up in: 1 year with Dr Shirlee Latch. (July 2014). You will receive a reminder letter in the mail two months in advance. If you don't receive a letter, please call our office to schedule the follow-up appointment.

## 2012-06-30 NOTE — Assessment & Plan Note (Signed)
Valve stable on last echo.  Needs antibiotic prophylaxis with dental work.

## 2012-06-30 NOTE — Assessment & Plan Note (Signed)
Has Medtronic dual chamber PCM with regular followup.

## 2012-06-30 NOTE — Assessment & Plan Note (Signed)
Lipids in 1/13 acceptable.  No h/o CAD.  Repeat in 1/14.

## 2012-06-30 NOTE — Progress Notes (Signed)
Patient ID: MUZAMMIL BRUINS, male   DOB: [birth year], 71 y.o.   MRN: [id number] PCP: Dr. Caryn Bee LIttle  71 yo with history of aortic valve replacement (bioprosthetic valve) and aortic root revision in 4/08 presents for cardiology followup.  He had a Medtronic dual chamber PCM placed post-operatively in 4/08 for complete heart block.  Echo in 1/13 showed normal EF and well-seated bioprosthetic AoV with mild AI.  No chest pain.  He walks for exercise and denies exertional dyspnea. He did not go for sleep study but also does not have daytime sleepiness (snores loudly).  Weight is down 1 lb.    Labs (1/13): LDL 86, HDL 42, LFTs normal, K 4.4, creatinine 1.1  ECG: AV sequential pacing  PMH: 1. OSA: s/p uvuloplasty > 10 years ago 2. S/p post bioprosthetic aortic valve replacement with #27 Medtronic FreeStyle valve in 4/08.  Also had aortic root revision.  Echo (1/13) with EF 50-55%, mild LVH, moderate diastolic dysfunction, bioprosthetic aortic valve with mean gradient 6 mmHg and mild AI, PA systolic pressure 37 mmHg.  3. Post-op complete heart block with Medtronic dual chamber PCM (4/08). 4. Hyperlipidemia 5. GERD 6. Cath pre-op in 2008 with mild atherosclerosis.  7. HTN  SH: Lives in [place], married, retired Careers adviser.  Quit smoking in 1985.    FH: Father with MI in his late 62s.  ROS: All systems reviewed and negative except as per HPI.   Current Outpatient Prescriptions  Medication Sig Dispense Refill  . aspirin EC 81 MG tablet Take 1 tablet (81 mg total) by mouth daily.      Marland Kitchen atorvastatin (LIPITOR) 10 MG tablet Take 1 tablet (10 mg total) by mouth daily.  90 tablet  3  . augmented betamethasone dipropionate (DIPROLENE-AF) 0.05 % cream as directed.      . Cholecalciferol (VITAMIN D PO) Take 200 Units by mouth daily.        . DiphenhydrAMINE HCl, Sleep, (SIMPLY SLEEP PO) Take by mouth at bedtime. 2 AT NIGHT       . fish oil-omega-3 fatty acids 1000 MG capsule Take by mouth daily. 2  DAILY       . metoprolol tartrate (LOPRESSOR) 25 MG tablet Take 1 tablet (25 mg total) by mouth 2 (two) times daily.  180 tablet  3  . ranitidine (ZANTAC) 150 MG tablet Take 150 mg by mouth at bedtime.        . timolol (TIMOPTIC) 0.5 % ophthalmic solution as directed.      . halobetasol (ULTRAVATE) 0.05 % ointment As needed      . testosterone cypionate (DEPOTESTOTERONE CYPIONATE) 200 MG/ML injection 1 shot once a month        BP 126/72  Pulse 60  Ht 5\' 10"  (1.778 m)  Wt 98.431 kg (217 lb)  BMI 31.14 kg/m2 General: NAD, overweight Neck: Thick, no JVD, no thyromegaly or thyroid nodule.  Lungs: Clear to auscultation bilaterally with normal respiratory effort. CV: Nondisplaced PMI.  Heart regular S1/S2, no S3/S4, no murmur.  1+ ankle edema.  No carotid bruit.  Normal pedal pulses.  Abdomen: Soft, nontender, no hepatosplenomegaly, no distention.  Neurologic: Alert and oriented x 3.  Psych: Normal affect. Extremities: No clubbing or cyanosis.

## 2012-07-04 ENCOUNTER — Encounter: Payer: Self-pay | Admitting: *Deleted

## 2012-07-13 DIAGNOSIS — E291 Testicular hypofunction: Secondary | ICD-10-CM | POA: Diagnosis not present

## 2012-08-12 ENCOUNTER — Other Ambulatory Visit: Payer: Self-pay | Admitting: Dermatology

## 2012-08-12 DIAGNOSIS — L821 Other seborrheic keratosis: Secondary | ICD-10-CM | POA: Diagnosis not present

## 2012-08-12 DIAGNOSIS — Z85828 Personal history of other malignant neoplasm of skin: Secondary | ICD-10-CM | POA: Diagnosis not present

## 2012-08-12 DIAGNOSIS — L570 Actinic keratosis: Secondary | ICD-10-CM | POA: Diagnosis not present

## 2012-08-12 DIAGNOSIS — D485 Neoplasm of uncertain behavior of skin: Secondary | ICD-10-CM | POA: Diagnosis not present

## 2012-08-16 DIAGNOSIS — E291 Testicular hypofunction: Secondary | ICD-10-CM | POA: Diagnosis not present

## 2012-08-24 DIAGNOSIS — E291 Testicular hypofunction: Secondary | ICD-10-CM | POA: Diagnosis not present

## 2012-08-29 DIAGNOSIS — E291 Testicular hypofunction: Secondary | ICD-10-CM | POA: Diagnosis not present

## 2012-08-29 DIAGNOSIS — N400 Enlarged prostate without lower urinary tract symptoms: Secondary | ICD-10-CM | POA: Diagnosis not present

## 2012-09-26 ENCOUNTER — Ambulatory Visit (INDEPENDENT_AMBULATORY_CARE_PROVIDER_SITE_OTHER): Payer: Medicare Other | Admitting: *Deleted

## 2012-09-26 DIAGNOSIS — I442 Atrioventricular block, complete: Secondary | ICD-10-CM | POA: Diagnosis not present

## 2012-09-29 DIAGNOSIS — H4011X0 Primary open-angle glaucoma, stage unspecified: Secondary | ICD-10-CM | POA: Diagnosis not present

## 2012-09-29 DIAGNOSIS — H409 Unspecified glaucoma: Secondary | ICD-10-CM | POA: Diagnosis not present

## 2012-09-29 LAB — REMOTE PACEMAKER DEVICE
AL THRESHOLD: 0.875 V
ATRIAL PACING PM: 21
BAMS-0001: 175 {beats}/min
RV LEAD IMPEDENCE PM: 845 Ohm
RV LEAD THRESHOLD: 0.625 V

## 2012-10-01 DIAGNOSIS — Z23 Encounter for immunization: Secondary | ICD-10-CM | POA: Diagnosis not present

## 2012-10-04 DIAGNOSIS — E291 Testicular hypofunction: Secondary | ICD-10-CM | POA: Diagnosis not present

## 2012-10-06 ENCOUNTER — Encounter: Payer: Self-pay | Admitting: *Deleted

## 2012-10-17 ENCOUNTER — Encounter: Payer: Self-pay | Admitting: Internal Medicine

## 2012-11-04 DIAGNOSIS — E291 Testicular hypofunction: Secondary | ICD-10-CM | POA: Diagnosis not present

## 2012-11-10 ENCOUNTER — Other Ambulatory Visit: Payer: Self-pay | Admitting: Internal Medicine

## 2012-11-10 DIAGNOSIS — E7800 Pure hypercholesterolemia, unspecified: Secondary | ICD-10-CM

## 2012-11-10 MED ORDER — ATORVASTATIN CALCIUM 10 MG PO TABS: 10.0000 mg | ORAL_TABLET | Freq: Every day | ORAL | Status: DC

## 2012-11-25 ENCOUNTER — Encounter: Payer: Self-pay | Admitting: *Deleted

## 2012-11-25 DIAGNOSIS — Z950 Presence of cardiac pacemaker: Secondary | ICD-10-CM | POA: Insufficient documentation

## 2012-12-01 ENCOUNTER — Ambulatory Visit (INDEPENDENT_AMBULATORY_CARE_PROVIDER_SITE_OTHER): Payer: Medicare Other | Admitting: Internal Medicine

## 2012-12-01 ENCOUNTER — Encounter: Payer: Self-pay | Admitting: Internal Medicine

## 2012-12-01 VITALS — BP 133/75 | HR 62 | Ht 71.0 in | Wt 211.0 lb

## 2012-12-01 DIAGNOSIS — E7800 Pure hypercholesterolemia, unspecified: Secondary | ICD-10-CM

## 2012-12-01 DIAGNOSIS — I10 Essential (primary) hypertension: Secondary | ICD-10-CM | POA: Diagnosis not present

## 2012-12-01 DIAGNOSIS — I442 Atrioventricular block, complete: Secondary | ICD-10-CM

## 2012-12-01 LAB — PACEMAKER DEVICE OBSERVATION
AL IMPEDENCE PM: 517 Ohm
AL THRESHOLD: 0.75 V
ATRIAL PACING PM: 22
BATTERY VOLTAGE: 2.75 V

## 2012-12-01 MED ORDER — ATORVASTATIN CALCIUM 10 MG PO TABS: 10.0000 mg | ORAL_TABLET | Freq: Every day | ORAL | Status: DC

## 2012-12-01 MED ORDER — METOPROLOL TARTRATE 25 MG PO TABS: 25.0000 mg | ORAL_TABLET | Freq: Two times a day (BID) | ORAL | Status: DC

## 2012-12-01 NOTE — Progress Notes (Signed)
PCP: Mickie Hillier, MD Primary Cardiologist:  Dr Adela Ports Scott [name] is a 71 y.o. male who presents today for routine electrophysiology followup.  Since last being seen in our clinic, the patient reports doing very well.  Today, he denies symptoms of palpitations, chest pain, shortness of breath,  lower extremity edema, dizziness, presyncope, or syncope.  The patient is otherwise without complaint today.   Past Medical History  Diagnosis Date  . Hyperlipidemia   . Obesity   . Atherosclerosis   . Kidney stones   . Complete heart block     s/p PPM following AVR   Past Surgical History  Procedure Date  . Cardiac catheterization 03/28/2007    OVERALL CARDIAC SIZE AND SILHOUETTE WERE NORMAL. GLOBAL FUNCTION AND REGIONAL WALL WERE NORMAL  . Aortic valve replacement   . Aortic root replacement   . Insert / replace / remove pacemaker 03/2007    Current Outpatient Prescriptions  Medication Sig Dispense Refill  . aspirin EC 81 MG tablet Take 1 tablet (81 mg total) by mouth daily.      Marland Kitchen atorvastatin (LIPITOR) 10 MG tablet Take 1 tablet (10 mg total) by mouth daily.  90 tablet  3  . augmented betamethasone dipropionate (DIPROLENE-AF) 0.05 % cream as directed.      . Cholecalciferol (VITAMIN D PO) Take 200 Units by mouth daily.        . DiphenhydrAMINE HCl, Sleep, (SIMPLY SLEEP PO) Take by mouth at bedtime. 2 AT NIGHT       . fish oil-omega-3 fatty acids 1000 MG capsule Take by mouth daily. 2 DAILY       . halobetasol (ULTRAVATE) 0.05 % ointment As needed      . metoprolol tartrate (LOPRESSOR) 25 MG tablet Take 1 tablet (25 mg total) by mouth 2 (two) times daily.  180 tablet  3  . ranitidine (ZANTAC) 150 MG tablet Take 150 mg by mouth at bedtime.        Marland Kitchen testosterone cypionate (DEPOTESTOTERONE CYPIONATE) 200 MG/ML injection 1 shot once a month      . timolol (TIMOPTIC) 0.5 % ophthalmic solution as directed.        Physical Exam: Filed Vitals:   12/01/12 0913  BP: 133/75  Pulse: 62   Height: 5\' 11"  (1.803 m)  Weight: 211 lb (95.709 kg)    GEN- The patient is well appearing, alert and oriented x 3 today.   Head- normocephalic, atraumatic Eyes-  Sclera clear, conjunctiva pink Ears- hearing intact Oropharynx- clear Lungs- Clear to ausculation bilaterally, normal work of breathing Chest- pacemaker pocket is well healed Heart- Regular rate and rhythm, no murmurs, rubs or gallops, PMI not laterally displaced GI- soft, NT, ND, + BS Extremities- no clubbing, cyanosis, or edema  Pacemaker interrogation- reviewed in detail today,  See PACEART report  Assessment and Plan:   AV BLOCK, COMPLETE  Normal pacemaker function  See Pace Art report  No changes today   HYPERTENSION  Stable  No change required today   Hyperlipidemia Will refill Lipitor today Repeat labs per Dr Shirlee Latch in January  carelink every 3 months Return to see me in 1 year

## 2012-12-01 NOTE — Patient Instructions (Signed)
Your physician wants you to follow-up in: 12 months with Dr Jacquiline Doe will receive a reminder letter in the mail two months in advance. If you don't receive a letter, please call our office to schedule the follow-up appointment.  Remote monitoring is used to monitor your Pacemaker of ICD from home. This monitoring reduces the number of office visits required to check your device to one time per year. It allows [place] to keep an eye on the functioning of your device to ensure it is working properly. You are scheduled for a device check from home on 03/06/2013. You may send your transmission at any time that day. If you have a wireless device, the transmission will be sent automatically. After your physician reviews your transmission, you will receive a postcard with your next transmission date.

## 2012-12-02 DIAGNOSIS — E291 Testicular hypofunction: Secondary | ICD-10-CM | POA: Diagnosis not present

## 2012-12-06 DIAGNOSIS — J40 Bronchitis, not specified as acute or chronic: Secondary | ICD-10-CM | POA: Diagnosis not present

## 2012-12-27 ENCOUNTER — Other Ambulatory Visit: Payer: Medicare Other

## 2012-12-28 ENCOUNTER — Other Ambulatory Visit (INDEPENDENT_AMBULATORY_CARE_PROVIDER_SITE_OTHER): Payer: Medicare Other

## 2012-12-28 DIAGNOSIS — I359 Nonrheumatic aortic valve disorder, unspecified: Secondary | ICD-10-CM

## 2012-12-28 DIAGNOSIS — E785 Hyperlipidemia, unspecified: Secondary | ICD-10-CM | POA: Diagnosis not present

## 2012-12-28 LAB — LIPID PANEL
Cholesterol: 175 mg/dL (ref 0–200)
HDL: 39.7 mg/dL (ref >39.00)
Total CHOL/HDL Ratio: 4
Triglycerides: 177 mg/dL — ABNORMAL HIGH (ref 0.0–149.0)

## 2012-12-28 LAB — BASIC METABOLIC PANEL
CO2: 33 mEq/L — ABNORMAL HIGH (ref 19–32)
Calcium: 9.1 mg/dL (ref 8.4–10.5)
Chloride: 104 mEq/L (ref 96–112)
Sodium: 140 mEq/L (ref 135–145)

## 2013-01-03 DIAGNOSIS — E291 Testicular hypofunction: Secondary | ICD-10-CM | POA: Diagnosis not present

## 2013-01-08 ENCOUNTER — Emergency Department (HOSPITAL_COMMUNITY)
Admission: EM | Admit: 2013-01-08 | Discharge: 2013-01-09 | Disposition: A | Discharge status: Home / Self Care | Payer: Medicare Other | Attending: Emergency Medicine | Admitting: Emergency Medicine

## 2013-01-08 ENCOUNTER — Encounter (HOSPITAL_COMMUNITY): Payer: Self-pay | Admitting: *Deleted

## 2013-01-08 DIAGNOSIS — N201 Calculus of ureter: Secondary | ICD-10-CM | POA: Diagnosis not present

## 2013-01-08 DIAGNOSIS — K8020 Calculus of gallbladder without cholecystitis without obstruction: Secondary | ICD-10-CM | POA: Diagnosis not present

## 2013-01-08 DIAGNOSIS — E785 Hyperlipidemia, unspecified: Secondary | ICD-10-CM | POA: Diagnosis not present

## 2013-01-08 DIAGNOSIS — Z8679 Personal history of other diseases of the circulatory system: Secondary | ICD-10-CM | POA: Insufficient documentation

## 2013-01-08 DIAGNOSIS — I7090 Unspecified atherosclerosis: Secondary | ICD-10-CM | POA: Diagnosis not present

## 2013-01-08 DIAGNOSIS — E669 Obesity, unspecified: Secondary | ICD-10-CM | POA: Diagnosis not present

## 2013-01-08 DIAGNOSIS — N200 Calculus of kidney: Secondary | ICD-10-CM | POA: Diagnosis not present

## 2013-01-08 DIAGNOSIS — Z87891 Personal history of nicotine dependence: Secondary | ICD-10-CM | POA: Diagnosis not present

## 2013-01-08 DIAGNOSIS — Z7982 Long term (current) use of aspirin: Secondary | ICD-10-CM | POA: Diagnosis not present

## 2013-01-08 DIAGNOSIS — Z79899 Other long term (current) drug therapy: Secondary | ICD-10-CM | POA: Diagnosis not present

## 2013-01-08 HISTORY — DX: Calculus of kidney: N20.0

## 2013-01-08 LAB — URINE MICROSCOPIC-ADD ON

## 2013-01-08 LAB — URINALYSIS, ROUTINE W REFLEX MICROSCOPIC
Bilirubin Urine: NEGATIVE
Glucose, UA: 100 mg/dL — AB
Ketones, ur: NEGATIVE mg/dL
Nitrite: NEGATIVE
pH: 5 (ref 5.0–8.0)

## 2013-01-08 LAB — BASIC METABOLIC PANEL
CO2: 26 mEq/L (ref 19–32)
Glucose, Bld: 103 mg/dL — ABNORMAL HIGH (ref 70–99)
Potassium: 4.2 mEq/L (ref 3.5–5.1)
Sodium: 136 mEq/L (ref 135–145)

## 2013-01-08 LAB — CBC WITH DIFFERENTIAL/PLATELET
Lymphocytes Relative: 15 % (ref 12–46)
Lymphs Abs: 1.7 10*3/uL (ref 0.7–4.0)
MCV: 96.1 fL (ref 78.0–100.0)
Neutrophils Relative %: 75 % (ref 43–77)
Platelets: 139 10*3/uL — ABNORMAL LOW (ref 150–400)
RBC: 4.64 MIL/uL (ref 4.22–5.81)
WBC: 11.5 10*3/uL — ABNORMAL HIGH (ref 4.0–10.5)

## 2013-01-08 MED ORDER — FENTANYL CITRATE 0.05 MG/ML IJ SOLN
50.0000 ug | Freq: Once | INTRAMUSCULAR | Status: AC
  Administered 2013-01-08: 50 ug via INTRAVENOUS
  Filled 2013-01-08: qty 2

## 2013-01-08 MED ORDER — ONDANSETRON HCL 4 MG/2ML IJ SOLN
4.0000 mg | Freq: Once | INTRAMUSCULAR | Status: AC
  Administered 2013-01-08: 4 mg via INTRAVENOUS
  Filled 2013-01-08: qty 2

## 2013-01-08 NOTE — ED Notes (Signed)
Pt c/o L flank pain w/ n/v x 2 since 1900. Pt has hx of kidney stones in L kidney.

## 2013-01-08 NOTE — ED Notes (Signed)
YNW:[id number]<[id number]> Expected date:<BR> Expected time:<BR> Means of arrival:<BR> Comments:<BR> Triage 2

## 2013-01-08 NOTE — ED Provider Notes (Addendum)
History     CSN: [id number]  Arrival date & time 01/08/13  2031   First MD Initiated Contact with Patient 01/08/13 2239      Chief Complaint  Patient presents with  . Flank Pain    (Consider location/radiation/quality/duration/timing/severity/associated sxs/prior treatment) HPI Comments: Pateint developed L flank painseveral hours   Patient is a 72 y.o. male presenting with flank pain. The history is provided by the patient.  Flank Pain This is a new problem. The current episode started today. The problem has been gradually improving. Associated symptoms include nausea and vomiting. Pertinent negatives include no fever, rash or urinary symptoms. Nothing aggravates the symptoms. He has tried nothing for the symptoms. The treatment provided moderate relief.    Past Medical History  Diagnosis Date  . Hyperlipidemia   . Obesity   . Atherosclerosis   . Kidney stones   . Complete heart block     s/p PPM following AVR  . Kidney calculus     Past Surgical History  Procedure Date  . Cardiac catheterization 03/28/2007    OVERALL CARDIAC SIZE AND SILHOUETTE WERE NORMAL. GLOBAL FUNCTION AND REGIONAL WALL WERE NORMAL  . Aortic valve replacement   . Aortic root replacement   . Insert / replace / remove pacemaker 03/2007    Family History  Problem Relation Age of Onset  . Heart attack Father     History  Substance Use Topics  . Smoking status: Former Smoker -- 1.5 packs/day for 30 years    Types: Cigarettes    Quit date: 10/30/1984  . Smokeless tobacco: Never Used  . Alcohol Use: No      Review of Systems  Constitutional: Negative for fever, activity change and appetite change.  Gastrointestinal: Positive for nausea and vomiting.  Genitourinary: Positive for flank pain. Negative for hematuria.  Skin: Negative for rash.    Allergies  Clindamycin/lincomycin and Morphine  Home Medications   Current Outpatient Rx  Name  Route  Sig  Dispense  Refill  . ASPIRIN EC 81  MG PO TBEC   Oral   Take 1 tablet (81 mg total) by mouth daily.         . ATORVASTATIN CALCIUM 10 MG PO TABS   Oral   Take 1 tablet (10 mg total) by mouth daily.   90 tablet   3   . VITAMIN D PO   Oral   Take 200 Units by mouth daily.           Marland Kitchen SIMPLY SLEEP PO   Oral   Take 1-2 tablets by mouth at bedtime as needed. For sleep         . OMEGA-3 FATTY ACIDS 1000 MG PO CAPS   Oral   Take 1 g by mouth daily.          Marland Kitchen METOPROLOL TARTRATE 25 MG PO TABS   Oral   Take 1 tablet (25 mg total) by mouth 2 (two) times daily.   180 tablet   3   . TESTOSTERONE CYPIONATE 200 MG/ML IM OIL      1 shot once a month         . TIMOLOL MALEATE 0.5 % OP SOLN   Right Eye   Place 1 drop into the right eye 2 (two) times daily.          . OXYCODONE-ACETAMINOPHEN 5-325 MG PO TABS   Oral   Take 1-2 tablets by mouth every 6 (six) hours as needed for  pain.   20 tablet   0   . PROMETHAZINE HCL 25 MG PO TABS   Oral   Take 1 tablet (25 mg total) by mouth every 6 (six) hours as needed for nausea.   30 tablet   0   . RANITIDINE HCL 150 MG PO TABS   Oral   Take 150 mg by mouth at bedtime as needed. For acid reflux           BP 120/58  Pulse 71  Temp 97.8 F (36.6 C) (Oral)  Resp 16  Ht 5\' 11"  (1.803 m)  Wt 206 lb (93.441 kg)  BMI 28.73 kg/m2  SpO2 100%  Physical Exam  Constitutional: He appears well-developed.  HENT:  Head: Normocephalic.  Mouth/Throat: Oropharynx is clear and moist.  Neck: Normal range of motion. Neck supple.  Cardiovascular: Normal rate.   Pulmonary/Chest: Effort normal. He exhibits no tenderness.  Abdominal: Soft. He exhibits no distension. There is no tenderness.  Musculoskeletal: Normal range of motion.  Skin: Skin is warm.    ED Course  Procedures (including critical care time)  Labs Reviewed  URINALYSIS, ROUTINE W REFLEX MICROSCOPIC - Abnormal; Notable for the following:    APPearance CLOUDY (*)     Specific Gravity, Urine 1.031  (*)     Glucose, UA 100 (*)     Hgb urine dipstick LARGE (*)     All other components within normal limits  CBC WITH DIFFERENTIAL - Abnormal; Notable for the following:    WBC 11.5 (*)     MCH 34.3 (*)     Platelets 139 (*)     Neutro Abs 8.7 (*)     All other components within normal limits  BASIC METABOLIC PANEL - Abnormal; Notable for the following:    Glucose, Bld 103 (*)     GFR calc non Af Amer 81 (*)     All other components within normal limits  URINE MICROSCOPIC-ADD ON - Abnormal; Notable for the following:    Bacteria, UA FEW (*)     Casts HYALINE CASTS (*)     Crystals CA OXALATE CRYSTALS (*)     All other components within normal limits   Ct Abdomen Pelvis Wo Contrast  01/09/2013  *RADIOLOGY REPORT*  Clinical Data: Left flank pain.  CT ABDOMEN AND PELVIS WITHOUT CONTRAST  Technique:  Multidetector CT imaging of the abdomen and pelvis was performed following the standard protocol without intravenous contrast.  Comparison: None.  Findings: There may be a few punctate nodular densities at the left lung base but, otherwise, the lung bases are clear.  Patient is status post median sternotomy and has cardiac pacemaker leads. There is no evidence for free intraperitoneal air.  There is a mild left hydronephrosis and mild stranding around the left renal pelvis.  There is a 2 mm stone in the proximal left ureter.  Calcifications in the right renal hilum are likely vascular in etiology.  No evidence for right kidney stones or hydronephrosis.  There is a 1.6 cm exophytic cyst in the left kidney lower pole.  Punctate low density along the hepatic dome is likely an incidental finding.  There are small calcified gallstones.  No gross abnormality to the spleen, pancreas or adrenal glands.  The abdominal aorta is calcified without aneurysmal dilatation.  Calcifications involving the prostate gland.  No definite stones within the urinary bladder.  Multiple diverticula involving the sigmoid colon and  descending colon.  Normal appearance of the appendix.  There is a large lucent structure involving the left iliac bone just above the left acetabulum. The structure measures up to 2.9 cm and has sclerotic margins.  This structure appears cystic with benign characteristics.  IMPRESSION: Mild left hydronephrosis due to a 2 mm stone in the proximal left ureter.  Cholelithiasis.   Original Report Authenticated By: Richarda Overlie, M.D.      1. Kidney stone       MDM  2mm stne        Arman Filter, NP 01/09/13 0200  Arman Filter, NP 01/24/13 1019

## 2013-01-09 ENCOUNTER — Emergency Department (HOSPITAL_COMMUNITY): Payer: Medicare Other

## 2013-01-09 DIAGNOSIS — K8020 Calculus of gallbladder without cholecystitis without obstruction: Secondary | ICD-10-CM | POA: Diagnosis not present

## 2013-01-09 DIAGNOSIS — N201 Calculus of ureter: Secondary | ICD-10-CM | POA: Diagnosis not present

## 2013-01-09 MED ORDER — PROMETHAZINE HCL 25 MG PO TABS: 25.0000 mg | ORAL_TABLET | Freq: Four times a day (QID) | ORAL | Status: DC | PRN

## 2013-01-09 MED ORDER — FENTANYL CITRATE 0.05 MG/ML IJ SOLN
50.0000 ug | Freq: Once | INTRAMUSCULAR | Status: AC
  Administered 2013-01-09: 50 ug via INTRAVENOUS
  Filled 2013-01-09: qty 2

## 2013-01-09 MED ORDER — OXYCODONE-ACETAMINOPHEN 5-325 MG PO TABS: 1.0000 | ORAL_TABLET | Freq: Four times a day (QID) | ORAL | Status: DC | PRN

## 2013-01-09 NOTE — ED Provider Notes (Signed)
Medical screening examination/treatment/procedure(s) were conducted as a shared visit with non-physician practitioner(s) and myself.  I personally evaluated the patient during the encounter.  Pt nontoxic, pain controlled in ED.  Hurman Horn, MD 01/15/13 [phone]

## 2013-01-26 DIAGNOSIS — N201 Calculus of ureter: Secondary | ICD-10-CM | POA: Diagnosis not present

## 2013-01-26 DIAGNOSIS — E291 Testicular hypofunction: Secondary | ICD-10-CM | POA: Diagnosis not present

## 2013-01-29 NOTE — ED Provider Notes (Signed)
Medical screening examination/treatment/procedure(s) were conducted as a shared visit with non-physician practitioner(s) and myself.  I personally evaluated the patient during the encounter  Scott Horn, MD 01/29/13 [phone]

## 2013-02-13 DIAGNOSIS — L821 Other seborrheic keratosis: Secondary | ICD-10-CM | POA: Diagnosis not present

## 2013-02-13 DIAGNOSIS — L820 Inflamed seborrheic keratosis: Secondary | ICD-10-CM | POA: Diagnosis not present

## 2013-02-13 DIAGNOSIS — L570 Actinic keratosis: Secondary | ICD-10-CM | POA: Diagnosis not present

## 2013-02-23 DIAGNOSIS — E291 Testicular hypofunction: Secondary | ICD-10-CM | POA: Diagnosis not present

## 2013-02-23 DIAGNOSIS — N400 Enlarged prostate without lower urinary tract symptoms: Secondary | ICD-10-CM | POA: Diagnosis not present

## 2013-03-02 DIAGNOSIS — N529 Male erectile dysfunction, unspecified: Secondary | ICD-10-CM | POA: Diagnosis not present

## 2013-03-02 DIAGNOSIS — E291 Testicular hypofunction: Secondary | ICD-10-CM | POA: Diagnosis not present

## 2013-03-02 DIAGNOSIS — Z125 Encounter for screening for malignant neoplasm of prostate: Secondary | ICD-10-CM | POA: Diagnosis not present

## 2013-03-06 ENCOUNTER — Encounter: Payer: Medicare Other | Admitting: *Deleted

## 2013-03-15 ENCOUNTER — Encounter: Payer: Self-pay | Admitting: *Deleted

## 2013-03-21 ENCOUNTER — Other Ambulatory Visit: Payer: Self-pay | Admitting: Internal Medicine

## 2013-03-21 ENCOUNTER — Ambulatory Visit (INDEPENDENT_AMBULATORY_CARE_PROVIDER_SITE_OTHER): Payer: Medicare Other | Admitting: *Deleted

## 2013-03-21 DIAGNOSIS — I442 Atrioventricular block, complete: Secondary | ICD-10-CM | POA: Diagnosis not present

## 2013-03-21 DIAGNOSIS — Z950 Presence of cardiac pacemaker: Secondary | ICD-10-CM

## 2013-03-22 DIAGNOSIS — E291 Testicular hypofunction: Secondary | ICD-10-CM | POA: Diagnosis not present

## 2013-03-24 LAB — REMOTE PACEMAKER DEVICE
AL AMPLITUDE: 2.8 mv
AL IMPEDENCE PM: 534 Ohm
AL THRESHOLD: 1 V
BATTERY VOLTAGE: 2.75 V

## 2013-03-30 DIAGNOSIS — H409 Unspecified glaucoma: Secondary | ICD-10-CM | POA: Diagnosis not present

## 2013-03-30 DIAGNOSIS — H4011X0 Primary open-angle glaucoma, stage unspecified: Secondary | ICD-10-CM | POA: Diagnosis not present

## 2013-04-04 ENCOUNTER — Encounter: Payer: Self-pay | Admitting: *Deleted

## 2013-04-12 DIAGNOSIS — E291 Testicular hypofunction: Secondary | ICD-10-CM | POA: Diagnosis not present

## 2013-04-14 ENCOUNTER — Encounter: Payer: Self-pay | Admitting: Internal Medicine

## 2013-04-25 ENCOUNTER — Encounter: Payer: Self-pay | Admitting: Cardiology

## 2013-05-03 DIAGNOSIS — E291 Testicular hypofunction: Secondary | ICD-10-CM | POA: Diagnosis not present

## 2013-05-24 DIAGNOSIS — E291 Testicular hypofunction: Secondary | ICD-10-CM | POA: Diagnosis not present

## 2013-05-30 ENCOUNTER — Telehealth: Payer: Self-pay | Admitting: Cardiology

## 2013-05-30 NOTE — Telephone Encounter (Signed)
New Prob       Pt called in wanting to schedule routine yearly visit. No spots available on Dr. Kathlyn [place] schedule.

## 2013-05-30 NOTE — Telephone Encounter (Signed)
You can offer him appt with PA on a day Dr Shirlee Latch is here.

## 2013-06-14 DIAGNOSIS — E291 Testicular hypofunction: Secondary | ICD-10-CM | POA: Diagnosis not present

## 2013-06-26 ENCOUNTER — Encounter: Payer: Medicare Other | Admitting: *Deleted

## 2013-07-03 DIAGNOSIS — L821 Other seborrheic keratosis: Secondary | ICD-10-CM | POA: Diagnosis not present

## 2013-07-03 DIAGNOSIS — L570 Actinic keratosis: Secondary | ICD-10-CM | POA: Diagnosis not present

## 2013-07-07 ENCOUNTER — Encounter: Payer: Self-pay | Admitting: *Deleted

## 2013-07-07 DIAGNOSIS — E291 Testicular hypofunction: Secondary | ICD-10-CM | POA: Diagnosis not present

## 2013-07-13 ENCOUNTER — Ambulatory Visit (INDEPENDENT_AMBULATORY_CARE_PROVIDER_SITE_OTHER): Payer: Medicare Other | Admitting: *Deleted

## 2013-07-13 ENCOUNTER — Encounter: Payer: Self-pay | Admitting: Internal Medicine

## 2013-07-13 DIAGNOSIS — Z950 Presence of cardiac pacemaker: Secondary | ICD-10-CM | POA: Diagnosis not present

## 2013-07-13 DIAGNOSIS — I442 Atrioventricular block, complete: Secondary | ICD-10-CM | POA: Diagnosis not present

## 2013-07-17 ENCOUNTER — Other Ambulatory Visit: Payer: Self-pay

## 2013-07-17 ENCOUNTER — Encounter: Payer: Self-pay | Admitting: Cardiology

## 2013-07-17 ENCOUNTER — Ambulatory Visit (INDEPENDENT_AMBULATORY_CARE_PROVIDER_SITE_OTHER): Payer: Medicare Other | Admitting: Cardiology

## 2013-07-17 VITALS — BP 138/70 | HR 71 | Ht 71.0 in | Wt 215.2 lb

## 2013-07-17 DIAGNOSIS — E785 Hyperlipidemia, unspecified: Secondary | ICD-10-CM

## 2013-07-17 DIAGNOSIS — E7800 Pure hypercholesterolemia, unspecified: Secondary | ICD-10-CM | POA: Diagnosis not present

## 2013-07-17 DIAGNOSIS — Z954 Presence of other heart-valve replacement: Secondary | ICD-10-CM | POA: Diagnosis not present

## 2013-07-17 DIAGNOSIS — I10 Essential (primary) hypertension: Secondary | ICD-10-CM | POA: Diagnosis not present

## 2013-07-17 DIAGNOSIS — Z952 Presence of prosthetic heart valve: Secondary | ICD-10-CM

## 2013-07-17 DIAGNOSIS — I442 Atrioventricular block, complete: Secondary | ICD-10-CM

## 2013-07-17 MED ORDER — ATORVASTATIN CALCIUM 10 MG PO TABS: 10.0000 mg | ORAL_TABLET | Freq: Every day | ORAL | Status: DC

## 2013-07-17 MED ORDER — METOPROLOL TARTRATE 25 MG PO TABS: 25.0000 mg | ORAL_TABLET | Freq: Two times a day (BID) | ORAL | Status: DC

## 2013-07-17 NOTE — Patient Instructions (Signed)
Your physician recommends that you return for a FASTING lipid profile in the next week or so.   Your physician wants you to follow-up in: 1 year with Dr Shirlee Latch. (July 2015).  You will receive a reminder letter in the mail two months in advance. If you don't receive a letter, please call our office to schedule the follow-up appointment.

## 2013-07-18 NOTE — Progress Notes (Signed)
Patient ID: Scott [name], male   DOB: 1941-06-05, 72 y.o.   MRN: [id number] PCP: Dr. Caryn Bee LIttle  72 yo with history of aortic valve replacement (bioprosthetic valve) and aortic root revision in 4/08 presents for cardiology followup.  He had a Medtronic dual chamber PCM placed post-operatively in 4/08 for complete heart block.  Echo in 1/13 showed normal EF and well-seated bioprosthetic AoV with mild AI.  No chest pain.  He walks and golfs for exercise and denies exertional dyspnea. Weight is down 2 lbs since last appointment.    Labs (1/13): LDL 86, HDL 42, LFTs normal, K 4.4, creatinine 1.1 Labs (1/14): K 4.1, creatinine 1.1, LDL 100, HDL 40  ECG: A-sensed, V-paced  PMH: 1. OSA: s/p uvuloplasty > 10 years ago 2. S/p post bioprosthetic aortic valve replacement with #27 Medtronic FreeStyle valve in 4/08.  Also had aortic root revision.  Echo (1/13) with EF 50-55%, mild LVH, moderate diastolic dysfunction, bioprosthetic aortic valve with mean gradient 6 mmHg and mild AI, PA systolic pressure 37 mmHg.  3. Post-op complete heart block with Medtronic dual chamber PCM (4/08). 4. Hyperlipidemia 5. GERD 6. Cath pre-op in 2008 with mild atherosclerosis.  7. HTN 8. Nephrolithiasis  SH: Lives in [place], married, retired Careers adviser.  Quit smoking in 1985.    FH: Father with MI in his late 66s.  ROS: All systems reviewed and negative except as per HPI.   Current Outpatient Prescriptions  Medication Sig Dispense Refill  . aspirin EC 81 MG tablet Take 1 tablet (81 mg total) by mouth daily.      . Cholecalciferol (VITAMIN D PO) Take 200 Units by mouth daily.        . DiphenhydrAMINE HCl, Sleep, (SIMPLY SLEEP PO) Take 1-2 tablets by mouth at bedtime as needed. For sleep      . fish oil-omega-3 fatty acids 1000 MG capsule Take 1 g by mouth daily.       . ranitidine (ZANTAC) 150 MG tablet Take 150 mg by mouth at bedtime as needed. For acid reflux      . testosterone cypionate (DEPOTESTOTERONE  CYPIONATE) 200 MG/ML injection 1 shot once a month      . timolol (TIMOPTIC) 0.5 % ophthalmic solution Place 1 drop into the right eye 2 (two) times daily.       Marland Kitchen atorvastatin (LIPITOR) 10 MG tablet Take 1 tablet (10 mg total) by mouth daily.  90 tablet  3  . metoprolol tartrate (LOPRESSOR) 25 MG tablet Take 1 tablet (25 mg total) by mouth 2 (two) times daily.  180 tablet  3   No current facility-administered medications for this visit.    BP 138/70  Pulse 71  Ht 5\' 11"  (1.803 m)  Wt 97.614 kg (215 lb 3.2 oz)  BMI 30.03 kg/m2 General: NAD, overweight Neck: Thick, no JVD, no thyromegaly or thyroid nodule.  Lungs: Clear to auscultation bilaterally with normal respiratory effort. CV: Nondisplaced PMI.  Heart regular S1/S2, no S3/S4, 1/6 SEM.  No edema.  No carotid bruit.  Normal pedal pulses.  Abdomen: Soft, nontender, no hepatosplenomegaly, no distention.  Neurologic: Alert and oriented x 3.  Psych: Normal affect. Extremities: No clubbing or cyanosis.   Assessment/Plan: 1. Bioprosthetic aortic valve replacement and root revision: Patient is doing well symptomatically.  Last echo in 1/13 showed a well-seated valve.  Needs prophylactic antibiotics with dental work.  2. Hyperlipidemia: Check lipids.  3. Complete heart block: Has Medtronic PCM.   Marca Ancona 07/18/2013

## 2013-07-24 ENCOUNTER — Other Ambulatory Visit (INDEPENDENT_AMBULATORY_CARE_PROVIDER_SITE_OTHER): Payer: Medicare Other

## 2013-07-24 DIAGNOSIS — E785 Hyperlipidemia, unspecified: Secondary | ICD-10-CM

## 2013-07-24 DIAGNOSIS — Z952 Presence of prosthetic heart valve: Secondary | ICD-10-CM

## 2013-07-24 DIAGNOSIS — Z954 Presence of other heart-valve replacement: Secondary | ICD-10-CM

## 2013-07-24 LAB — LIPID PANEL
Cholesterol: 200 mg/dL (ref 0–200)
LDL Cholesterol: 129 mg/dL — ABNORMAL HIGH (ref 0–99)
VLDL: 28 mg/dL (ref 0.0–40.0)

## 2013-07-25 ENCOUNTER — Other Ambulatory Visit: Payer: Self-pay | Admitting: *Deleted

## 2013-07-25 DIAGNOSIS — E7800 Pure hypercholesterolemia, unspecified: Secondary | ICD-10-CM

## 2013-07-25 DIAGNOSIS — Z954 Presence of other heart-valve replacement: Secondary | ICD-10-CM

## 2013-07-25 DIAGNOSIS — I10 Essential (primary) hypertension: Secondary | ICD-10-CM

## 2013-07-26 DIAGNOSIS — E291 Testicular hypofunction: Secondary | ICD-10-CM | POA: Diagnosis not present

## 2013-07-27 LAB — REMOTE PACEMAKER DEVICE
AL IMPEDENCE PM: 544 Ohm
ATRIAL PACING PM: 20
RV LEAD IMPEDENCE PM: 858 Ohm
RV LEAD THRESHOLD: 0.75 V

## 2013-08-02 ENCOUNTER — Encounter: Payer: Self-pay | Admitting: *Deleted

## 2013-08-15 DIAGNOSIS — L821 Other seborrheic keratosis: Secondary | ICD-10-CM | POA: Diagnosis not present

## 2013-08-15 DIAGNOSIS — D1801 Hemangioma of skin and subcutaneous tissue: Secondary | ICD-10-CM | POA: Diagnosis not present

## 2013-08-15 DIAGNOSIS — L408 Other psoriasis: Secondary | ICD-10-CM | POA: Diagnosis not present

## 2013-08-15 DIAGNOSIS — Z85828 Personal history of other malignant neoplasm of skin: Secondary | ICD-10-CM | POA: Diagnosis not present

## 2013-08-15 DIAGNOSIS — D485 Neoplasm of uncertain behavior of skin: Secondary | ICD-10-CM | POA: Diagnosis not present

## 2013-08-15 DIAGNOSIS — L570 Actinic keratosis: Secondary | ICD-10-CM | POA: Diagnosis not present

## 2013-08-16 DIAGNOSIS — E291 Testicular hypofunction: Secondary | ICD-10-CM | POA: Diagnosis not present

## 2013-08-31 DIAGNOSIS — E291 Testicular hypofunction: Secondary | ICD-10-CM | POA: Diagnosis not present

## 2013-09-06 ENCOUNTER — Other Ambulatory Visit: Payer: Medicare Other

## 2013-09-07 DIAGNOSIS — E291 Testicular hypofunction: Secondary | ICD-10-CM | POA: Diagnosis not present

## 2013-09-19 ENCOUNTER — Telehealth: Payer: Self-pay | Admitting: Cardiology

## 2013-09-19 DIAGNOSIS — E785 Hyperlipidemia, unspecified: Secondary | ICD-10-CM

## 2013-09-19 NOTE — Telephone Encounter (Signed)
Returned call to patient he stated it was time for him to repeat lipids after increasing atorvastatin for a couple of months.Patient will have fasting lipid and hepatic panels 09/21/13.

## 2013-09-19 NOTE — Telephone Encounter (Signed)
New problem    Need an order for blood work .

## 2013-09-21 ENCOUNTER — Other Ambulatory Visit (INDEPENDENT_AMBULATORY_CARE_PROVIDER_SITE_OTHER): Payer: Medicare Other

## 2013-09-21 DIAGNOSIS — E785 Hyperlipidemia, unspecified: Secondary | ICD-10-CM | POA: Diagnosis not present

## 2013-09-21 LAB — LIPID PANEL
HDL: 40.5 mg/dL (ref >39.00)
LDL Cholesterol: 111 mg/dL — ABNORMAL HIGH (ref 0–99)
Total CHOL/HDL Ratio: 5
Triglycerides: 181 mg/dL — ABNORMAL HIGH (ref 0.0–149.0)
VLDL: 36.2 mg/dL (ref 0.0–40.0)

## 2013-09-21 LAB — HEPATIC FUNCTION PANEL
Albumin: 3.7 g/dL (ref 3.5–5.2)
Alkaline Phosphatase: 67 U/L (ref 39–117)

## 2013-09-25 ENCOUNTER — Other Ambulatory Visit: Payer: Self-pay | Admitting: *Deleted

## 2013-09-25 MED ORDER — ATORVASTATIN CALCIUM 20 MG PO TABS: 20.0000 mg | ORAL_TABLET | Freq: Every day | ORAL | Status: DC

## 2013-09-28 DIAGNOSIS — H4011X0 Primary open-angle glaucoma, stage unspecified: Secondary | ICD-10-CM | POA: Diagnosis not present

## 2013-09-28 DIAGNOSIS — H409 Unspecified glaucoma: Secondary | ICD-10-CM | POA: Diagnosis not present

## 2013-12-01 DIAGNOSIS — J309 Allergic rhinitis, unspecified: Secondary | ICD-10-CM | POA: Diagnosis not present

## 2013-12-01 DIAGNOSIS — J329 Chronic sinusitis, unspecified: Secondary | ICD-10-CM | POA: Diagnosis not present

## 2013-12-08 ENCOUNTER — Encounter: Payer: Self-pay | Admitting: Internal Medicine

## 2013-12-08 ENCOUNTER — Telehealth: Payer: Self-pay | Admitting: Internal Medicine

## 2013-12-08 ENCOUNTER — Ambulatory Visit (INDEPENDENT_AMBULATORY_CARE_PROVIDER_SITE_OTHER): Payer: Medicare Other | Admitting: Internal Medicine

## 2013-12-08 VITALS — BP 136/70 | HR 74 | Ht 71.0 in | Wt 210.0 lb

## 2013-12-08 DIAGNOSIS — I10 Essential (primary) hypertension: Secondary | ICD-10-CM

## 2013-12-08 DIAGNOSIS — I442 Atrioventricular block, complete: Secondary | ICD-10-CM

## 2013-12-08 NOTE — Progress Notes (Signed)
PCP: Mickie Hillier, MD Primary Cardiologist:  Dr Adela Ports Scott [name] is a 72 y.o. male who presents today for routine electrophysiology followup.  Since last being seen in our clinic, the patient reports doing very well.  Today, he denies symptoms of palpitations, chest pain, shortness of breath,  lower extremity edema, dizziness, presyncope, or syncope.  The patient is otherwise without complaint today.   Past Medical History  Diagnosis Date  . Hyperlipidemia   . Obesity   . Atherosclerosis   . Kidney stones   . Complete heart block     s/p PPM following AVR  . Kidney calculus    Past Surgical History  Procedure Laterality Date  . Cardiac catheterization  03/28/2007    OVERALL CARDIAC SIZE AND SILHOUETTE WERE NORMAL. GLOBAL FUNCTION AND REGIONAL WALL WERE NORMAL  . Aortic valve replacement    . Aortic root replacement    . Insert / replace / remove pacemaker  03/2007    Current Outpatient Prescriptions  Medication Sig Dispense Refill  . aspirin EC 81 MG tablet Take 1 tablet (81 mg total) by mouth daily.      Marland Kitchen atorvastatin (LIPITOR) 20 MG tablet Take 1 tablet (20 mg total) by mouth daily.  90 tablet  3  . Cholecalciferol (VITAMIN D PO) Take 200 Units by mouth daily.        . DiphenhydrAMINE HCl, Sleep, (SIMPLY SLEEP PO) Take 1-2 tablets by mouth at bedtime as needed. For sleep      . fish oil-omega-3 fatty acids 1000 MG capsule Take 1 g by mouth daily.       . metoprolol tartrate (LOPRESSOR) 25 MG tablet Take 1 tablet (25 mg total) by mouth 2 (two) times daily.  180 tablet  3  . ranitidine (ZANTAC) 150 MG tablet Take 150 mg by mouth at bedtime as needed. For acid reflux      . timolol (TIMOPTIC) 0.5 % ophthalmic solution Place 1 drop into the right eye 2 (two) times daily.        No current facility-administered medications for this visit.    Physical Exam: Filed Vitals:   12/08/13 0846  BP: 136/70  Pulse: 74  Height: 5\' 11"  (1.803 m)  Weight: 210 lb (95.255 kg)     GEN- The patient is well appearing, alert and oriented x 3 today.   Head- normocephalic, atraumatic Eyes-  Sclera clear, conjunctiva pink Ears- hearing intact Oropharynx- clear Lungs- Clear to ausculation bilaterally, normal work of breathing Chest- pacemaker pocket is well healed Heart- Regular rate and rhythm, no murmurs, rubs or gallops, PMI not laterally displaced GI- soft, NT, ND, + BS Extremities- no clubbing, cyanosis, or edema  Pacemaker interrogation- reviewed in detail today,  See PACEART report  Assessment and Plan:   AV BLOCK, COMPLETE  Normal pacemaker function  See Pace Art report  No changes today   HYPERTENSION  Stable  No change required today   carelink every 3 months Return to see me in 1 year

## 2013-12-08 NOTE — Telephone Encounter (Signed)
Patient to come in for re interrogation of his device.

## 2013-12-08 NOTE — Telephone Encounter (Signed)
New problem   Pt calling to say he has not felt right since his appt today,  Pt feels jittery right now and needs a call back about this feeling & device?

## 2013-12-08 NOTE — Patient Instructions (Addendum)
Remote monitoring is used to monitor your pacemaker from home. This monitoring reduces the number of office visits required to check your device to one time per year. It allows [place] to keep an eye on the functioning of your device to ensure it is working properly. You are scheduled for a device check from home on 03-12-2014. You may send your transmission at any time that day. If you have a wireless device, the transmission will be sent automatically. After your physician reviews your transmission, you will receive a postcard with your next transmission date.  Your physician wants you to follow-up in: 12 months with Dr Jacquiline Doe will receive a reminder letter in the mail two months in advance. If you don't receive a letter, please call our office to schedule the follow-up appointment.

## 2013-12-11 LAB — MDC_IDC_ENUM_SESS_TYPE_INCLINIC
Brady Statistic AP VP Percent: 23 %
Brady Statistic AP VS Percent: 0 %
Brady Statistic AS VP Percent: 77 %
Date Time Interrogation Session: 20141219140330
Lead Channel Impedance Value: 847 Ohm
Lead Channel Pacing Threshold Amplitude: 0.75 V
Lead Channel Pacing Threshold Amplitude: 0.75 V
Lead Channel Pacing Threshold Pulse Width: 0.4 ms
Lead Channel Sensing Intrinsic Amplitude: 1.4 mV

## 2013-12-29 DIAGNOSIS — J329 Chronic sinusitis, unspecified: Secondary | ICD-10-CM | POA: Diagnosis not present

## 2014-01-10 DIAGNOSIS — E291 Testicular hypofunction: Secondary | ICD-10-CM | POA: Diagnosis not present

## 2014-02-16 DIAGNOSIS — H9190 Unspecified hearing loss, unspecified ear: Secondary | ICD-10-CM | POA: Diagnosis not present

## 2014-02-16 DIAGNOSIS — J329 Chronic sinusitis, unspecified: Secondary | ICD-10-CM | POA: Diagnosis not present

## 2014-02-16 DIAGNOSIS — Z23 Encounter for immunization: Secondary | ICD-10-CM | POA: Diagnosis not present

## 2014-02-20 DIAGNOSIS — L570 Actinic keratosis: Secondary | ICD-10-CM | POA: Diagnosis not present

## 2014-02-20 DIAGNOSIS — Z85828 Personal history of other malignant neoplasm of skin: Secondary | ICD-10-CM | POA: Diagnosis not present

## 2014-02-20 DIAGNOSIS — L408 Other psoriasis: Secondary | ICD-10-CM | POA: Diagnosis not present

## 2014-02-20 DIAGNOSIS — L821 Other seborrheic keratosis: Secondary | ICD-10-CM | POA: Diagnosis not present

## 2014-02-20 DIAGNOSIS — D1801 Hemangioma of skin and subcutaneous tissue: Secondary | ICD-10-CM | POA: Diagnosis not present

## 2014-02-20 DIAGNOSIS — D485 Neoplasm of uncertain behavior of skin: Secondary | ICD-10-CM | POA: Diagnosis not present

## 2014-02-21 DIAGNOSIS — H903 Sensorineural hearing loss, bilateral: Secondary | ICD-10-CM | POA: Diagnosis not present

## 2014-02-21 DIAGNOSIS — H9319 Tinnitus, unspecified ear: Secondary | ICD-10-CM | POA: Diagnosis not present

## 2014-03-12 ENCOUNTER — Encounter: Payer: Medicare Other | Admitting: *Deleted

## 2014-03-29 DIAGNOSIS — H4011X0 Primary open-angle glaucoma, stage unspecified: Secondary | ICD-10-CM | POA: Diagnosis not present

## 2014-03-29 DIAGNOSIS — H409 Unspecified glaucoma: Secondary | ICD-10-CM | POA: Diagnosis not present

## 2014-04-02 ENCOUNTER — Encounter: Payer: Self-pay | Admitting: *Deleted

## 2014-04-03 DIAGNOSIS — N400 Enlarged prostate without lower urinary tract symptoms: Secondary | ICD-10-CM | POA: Diagnosis not present

## 2014-04-03 DIAGNOSIS — Z125 Encounter for screening for malignant neoplasm of prostate: Secondary | ICD-10-CM | POA: Diagnosis not present

## 2014-04-05 ENCOUNTER — Ambulatory Visit (INDEPENDENT_AMBULATORY_CARE_PROVIDER_SITE_OTHER): Payer: Medicare Other | Admitting: *Deleted

## 2014-04-05 DIAGNOSIS — I442 Atrioventricular block, complete: Secondary | ICD-10-CM

## 2014-04-05 DIAGNOSIS — I495 Sick sinus syndrome: Secondary | ICD-10-CM

## 2014-04-05 LAB — MDC_IDC_ENUM_SESS_TYPE_REMOTE
Battery Impedance: 1672 Ohm
Battery Remaining Longevity: 29 mo
Battery Voltage: 2.73 V
Brady Statistic AP VS Percent: 0 %
Brady Statistic AS VP Percent: 74 %
Lead Channel Pacing Threshold Amplitude: 1 V
Lead Channel Pacing Threshold Pulse Width: 0.4 ms
Lead Channel Sensing Intrinsic Amplitude: 1.4 mV
Lead Channel Setting Pacing Amplitude: 2 V
Lead Channel Setting Pacing Amplitude: 2.5 V
Lead Channel Setting Pacing Pulse Width: 0.4 ms
MDC IDC MSMT LEADCHNL RA IMPEDANCE VALUE: 496 Ohm
MDC IDC MSMT LEADCHNL RA PACING THRESHOLD PULSEWIDTH: 0.4 ms
MDC IDC MSMT LEADCHNL RV IMPEDANCE VALUE: 838 Ohm
MDC IDC MSMT LEADCHNL RV PACING THRESHOLD AMPLITUDE: 0.875 V
MDC IDC SESS DTM: 20150416192409
MDC IDC SET LEADCHNL RV SENSING SENSITIVITY: 4 mV
MDC IDC STAT BRADY AP VP PERCENT: 26 %
MDC IDC STAT BRADY AS VS PERCENT: 0 %

## 2014-04-10 DIAGNOSIS — R3915 Urgency of urination: Secondary | ICD-10-CM | POA: Diagnosis not present

## 2014-04-10 DIAGNOSIS — E291 Testicular hypofunction: Secondary | ICD-10-CM | POA: Diagnosis not present

## 2014-04-10 DIAGNOSIS — N400 Enlarged prostate without lower urinary tract symptoms: Secondary | ICD-10-CM | POA: Diagnosis not present

## 2014-04-17 ENCOUNTER — Encounter: Payer: Self-pay | Admitting: *Deleted

## 2014-04-19 ENCOUNTER — Encounter: Payer: Self-pay | Admitting: Internal Medicine

## 2014-05-31 DIAGNOSIS — L408 Other psoriasis: Secondary | ICD-10-CM | POA: Diagnosis not present

## 2014-05-31 DIAGNOSIS — Z85828 Personal history of other malignant neoplasm of skin: Secondary | ICD-10-CM | POA: Diagnosis not present

## 2014-05-31 DIAGNOSIS — L820 Inflamed seborrheic keratosis: Secondary | ICD-10-CM | POA: Diagnosis not present

## 2014-05-31 DIAGNOSIS — D485 Neoplasm of uncertain behavior of skin: Secondary | ICD-10-CM | POA: Diagnosis not present

## 2014-07-09 ENCOUNTER — Ambulatory Visit (INDEPENDENT_AMBULATORY_CARE_PROVIDER_SITE_OTHER): Payer: Medicare Other | Admitting: *Deleted

## 2014-07-09 ENCOUNTER — Encounter: Payer: Self-pay | Admitting: Internal Medicine

## 2014-07-09 DIAGNOSIS — I495 Sick sinus syndrome: Secondary | ICD-10-CM

## 2014-07-09 NOTE — Progress Notes (Signed)
Remote pacemaker transmission.   

## 2014-07-16 LAB — MDC_IDC_ENUM_SESS_TYPE_REMOTE
Battery Impedance: 1764 Ohm
Brady Statistic AP VS Percent: 0 %
Brady Statistic AS VS Percent: 0 %
Date Time Interrogation Session: 20150720125613
Lead Channel Impedance Value: 830 Ohm
Lead Channel Pacing Threshold Amplitude: 0.75 V
Lead Channel Pacing Threshold Amplitude: 0.875 V
Lead Channel Pacing Threshold Pulse Width: 0.4 ms
Lead Channel Pacing Threshold Pulse Width: 0.4 ms
Lead Channel Setting Pacing Amplitude: 2.5 V
Lead Channel Setting Sensing Sensitivity: 4 mV
MDC IDC MSMT BATTERY REMAINING LONGEVITY: 28 mo
MDC IDC MSMT BATTERY VOLTAGE: 2.73 V
MDC IDC MSMT LEADCHNL RA IMPEDANCE VALUE: 488 Ohm
MDC IDC MSMT LEADCHNL RA SENSING INTR AMPL: 2.8 mV
MDC IDC SET LEADCHNL RA PACING AMPLITUDE: 2 V
MDC IDC SET LEADCHNL RV PACING PULSEWIDTH: 0.4 ms
MDC IDC STAT BRADY AP VP PERCENT: 29 %
MDC IDC STAT BRADY AS VP PERCENT: 71 %

## 2014-07-30 ENCOUNTER — Other Ambulatory Visit: Payer: Self-pay | Admitting: Cardiology

## 2014-08-02 ENCOUNTER — Encounter: Payer: Self-pay | Admitting: Cardiology

## 2014-08-10 ENCOUNTER — Encounter: Payer: Self-pay | Admitting: Cardiology

## 2014-08-10 ENCOUNTER — Encounter: Payer: Self-pay | Admitting: *Deleted

## 2014-08-10 ENCOUNTER — Ambulatory Visit (INDEPENDENT_AMBULATORY_CARE_PROVIDER_SITE_OTHER): Payer: Medicare Other | Admitting: Cardiology

## 2014-08-10 VITALS — BP 134/80 | HR 65 | Ht 70.0 in | Wt 215.0 lb

## 2014-08-10 DIAGNOSIS — I059 Rheumatic mitral valve disease, unspecified: Secondary | ICD-10-CM | POA: Diagnosis not present

## 2014-08-10 DIAGNOSIS — I10 Essential (primary) hypertension: Secondary | ICD-10-CM

## 2014-08-10 DIAGNOSIS — Z954 Presence of other heart-valve replacement: Secondary | ICD-10-CM

## 2014-08-10 DIAGNOSIS — Z952 Presence of prosthetic heart valve: Secondary | ICD-10-CM

## 2014-08-10 DIAGNOSIS — E785 Hyperlipidemia, unspecified: Secondary | ICD-10-CM

## 2014-08-10 NOTE — Patient Instructions (Signed)
Your physician recommends that you return for lab work today--Lipid profile/BMET.  Your physician has requested that you have an echocardiogram. Echocardiography is a painless test that uses sound waves to create images of your heart. It provides your doctor with information about the size and shape of your heart and how well your heart's chambers and valves are working. This procedure takes approximately one hour. There are no restrictions for this procedure.  Your physician wants you to follow-up in: 1 year with Dr Aundra Dubin. (August 2016). You will receive a reminder letter in the mail two months in advance. If you don't receive a letter, please call our office to schedule the follow-up appointment.

## 2014-08-11 LAB — LIPID PANEL
CHOL/HDL RATIO: 4
Cholesterol: 168 mg/dL (ref 0–200)
HDL: 45.7 mg/dL (ref >39.00)
LDL CALC: 84 mg/dL (ref 0–99)
NonHDL: 122.3
TRIGLYCERIDES: 193 mg/dL — AB (ref 0.0–149.0)
VLDL: 38.6 mg/dL (ref 0.0–40.0)

## 2014-08-11 LAB — BASIC METABOLIC PANEL
BUN: 15 mg/dL (ref 6–23)
CO2: 29 mEq/L (ref 19–32)
CREATININE: 1 mg/dL (ref 0.4–1.5)
Calcium: 9 mg/dL (ref 8.4–10.5)
Chloride: 104 mEq/L (ref 96–112)
GFR: 80.09 mL/min (ref >60.00)
Glucose, Bld: 98 mg/dL (ref 70–99)
Potassium: 3.9 mEq/L (ref 3.5–5.1)
Sodium: 138 mEq/L (ref 135–145)

## 2014-08-11 NOTE — Progress Notes (Signed)
Patient ID: Scott [name], male   DOB: [birth year], 73 y.o.   MRN: [id number] PCP: Dr. Lennette Bihari LIttle  73 yo with history of aortic valve replacement (bioprosthetic valve) and aortic root revision in 4/08 presents for cardiology followup.  He had a Medtronic dual chamber PCM placed post-operatively in 4/08 for complete heart block.  Echo in 1/13 showed normal EF and well-seated bioprosthetic AoV with mild AI.  No chest pain.  No exertional dyspnea.  Not getting much exercise but does do some swimming. Weight is stable since last appointment.    Labs (1/13): LDL 86, HDL 42, LFTs normal, K 4.4, creatinine 1.1 Labs (1/14): K 4.1, creatinine 1.1, LDL 100, HDL 40 Labs (10/14): LDL 111, HDL 40.5  ECG: A-sensed (NSR), V-paced  PMH: 1. OSA: s/p uvuloplasty > 10 years ago 2. S/p post bioprosthetic aortic valve replacement with #27 Medtronic FreeStyle valve in 4/08.  Also had aortic root revision.  Echo (1/13) with EF 50-55%, mild LVH, moderate diastolic dysfunction, bioprosthetic aortic valve with mean gradient 6 mmHg and mild AI, PA systolic pressure 37 mmHg.  3. Post-op complete heart block with Medtronic dual chamber PCM (4/08). 4. Hyperlipidemia 5. GERD 6. Cath pre-op in 2008 with mild atherosclerosis.  7. HTN 8. Nephrolithiasis  SH: Lives in [place], married, retired Chief Technology Officer.  Quit smoking in 1985.    FH: Father with MI in his late 57s.  ROS: All systems reviewed and negative except as per HPI.   Current Outpatient Prescriptions  Medication Sig Dispense Refill  . aspirin EC 81 MG tablet Take 1 tablet (81 mg total) by mouth daily.      Marland Kitchen atorvastatin (LIPITOR) 20 MG tablet Take 1 tablet (20 mg total) by mouth daily.  90 tablet  3  . Cholecalciferol (VITAMIN D PO) Take 200 Units by mouth daily.        . DiphenhydrAMINE HCl, Sleep, (SIMPLY SLEEP PO) Take 1-2 tablets by mouth at bedtime as needed. For sleep      . fish oil-omega-3 fatty acids 1000 MG capsule Take 1 g by mouth daily.        . metoprolol tartrate (LOPRESSOR) 25 MG tablet TAKE ONE TABLET BY MOUTH TWICE DAILY  180 tablet  0  . ranitidine (ZANTAC) 150 MG tablet Take 150 mg by mouth at bedtime as needed. For acid reflux      . timolol (TIMOPTIC) 0.5 % ophthalmic solution Place 1 drop into the right eye 2 (two) times daily.        No current facility-administered medications for this visit.    BP 134/80  Pulse 65  Ht 5\' 10"  (1.778 m)  Wt 97.523 kg (215 lb)  BMI 30.85 kg/m2 General: NAD, overweight Neck: Thick, no JVD, no thyromegaly or thyroid nodule.  Lungs: Clear to auscultation bilaterally with normal respiratory effort. CV: Nondisplaced PMI.  Heart regular S1/S2, no S3/S4, 1/6 SEM.  No edema.  No carotid bruit.  Normal pedal pulses.  Abdomen: Soft, nontender, no hepatosplenomegaly, no distention.  Neurologic: Alert and oriented x 3.  Psych: Normal affect. Extremities: No clubbing or cyanosis.   Assessment/Plan: 1. Bioprosthetic aortic valve replacement and root revision: Patient is doing well symptomatically.  Last echo in 1/13 showed a well-seated valve.  Needs prophylactic antibiotics with dental work.  2. Hyperlipidemia: Check lipids.  3. Complete heart block: Has Medtronic PCM.   Followup in 1 year.   Loralie Champagne 08/11/2014

## 2014-08-15 ENCOUNTER — Ambulatory Visit (HOSPITAL_COMMUNITY): Payer: Medicare Other | Attending: Cardiology | Admitting: Radiology

## 2014-08-15 DIAGNOSIS — I059 Rheumatic mitral valve disease, unspecified: Secondary | ICD-10-CM

## 2014-08-15 DIAGNOSIS — I10 Essential (primary) hypertension: Secondary | ICD-10-CM | POA: Diagnosis not present

## 2014-08-15 DIAGNOSIS — I359 Nonrheumatic aortic valve disorder, unspecified: Secondary | ICD-10-CM | POA: Diagnosis not present

## 2014-08-15 DIAGNOSIS — E785 Hyperlipidemia, unspecified: Secondary | ICD-10-CM | POA: Insufficient documentation

## 2014-08-15 DIAGNOSIS — I442 Atrioventricular block, complete: Secondary | ICD-10-CM

## 2014-08-15 NOTE — Progress Notes (Signed)
Echocardiogram performed.  

## 2014-08-29 DIAGNOSIS — L820 Inflamed seborrheic keratosis: Secondary | ICD-10-CM | POA: Diagnosis not present

## 2014-08-29 DIAGNOSIS — L723 Sebaceous cyst: Secondary | ICD-10-CM | POA: Diagnosis not present

## 2014-08-29 DIAGNOSIS — D219 Benign neoplasm of connective and other soft tissue, unspecified: Secondary | ICD-10-CM | POA: Diagnosis not present

## 2014-08-29 DIAGNOSIS — Z85828 Personal history of other malignant neoplasm of skin: Secondary | ICD-10-CM | POA: Diagnosis not present

## 2014-08-29 DIAGNOSIS — D213 Benign neoplasm of connective and other soft tissue of thorax: Secondary | ICD-10-CM | POA: Diagnosis not present

## 2014-08-29 DIAGNOSIS — L821 Other seborrheic keratosis: Secondary | ICD-10-CM | POA: Diagnosis not present

## 2014-08-29 DIAGNOSIS — D485 Neoplasm of uncertain behavior of skin: Secondary | ICD-10-CM | POA: Diagnosis not present

## 2014-08-29 DIAGNOSIS — L218 Other seborrheic dermatitis: Secondary | ICD-10-CM | POA: Diagnosis not present

## 2014-08-29 DIAGNOSIS — L570 Actinic keratosis: Secondary | ICD-10-CM | POA: Diagnosis not present

## 2014-09-22 ENCOUNTER — Other Ambulatory Visit: Payer: Self-pay | Admitting: Cardiology

## 2014-10-04 DIAGNOSIS — H4011X3 Primary open-angle glaucoma, severe stage: Secondary | ICD-10-CM | POA: Diagnosis not present

## 2014-10-11 DIAGNOSIS — Z23 Encounter for immunization: Secondary | ICD-10-CM | POA: Diagnosis not present

## 2014-10-15 ENCOUNTER — Encounter: Payer: Medicare Other | Admitting: *Deleted

## 2014-10-15 ENCOUNTER — Telehealth: Payer: Self-pay | Admitting: Cardiology

## 2014-10-15 NOTE — Telephone Encounter (Signed)
LMOVM reminding pt to send remote transmission.   

## 2014-10-19 DIAGNOSIS — J40 Bronchitis, not specified as acute or chronic: Secondary | ICD-10-CM | POA: Diagnosis not present

## 2014-10-19 DIAGNOSIS — R0982 Postnasal drip: Secondary | ICD-10-CM | POA: Diagnosis not present

## 2014-10-19 DIAGNOSIS — R062 Wheezing: Secondary | ICD-10-CM | POA: Diagnosis not present

## 2014-10-22 ENCOUNTER — Encounter: Payer: Self-pay | Admitting: Cardiology

## 2014-11-03 ENCOUNTER — Other Ambulatory Visit: Payer: Self-pay | Admitting: Cardiology

## 2014-12-10 ENCOUNTER — Encounter: Payer: Self-pay | Admitting: Internal Medicine

## 2014-12-10 ENCOUNTER — Ambulatory Visit (INDEPENDENT_AMBULATORY_CARE_PROVIDER_SITE_OTHER): Payer: Medicare Other | Admitting: Internal Medicine

## 2014-12-10 VITALS — BP 122/72 | HR 61 | Ht 70.0 in | Wt 214.1 lb

## 2014-12-10 DIAGNOSIS — I442 Atrioventricular block, complete: Secondary | ICD-10-CM | POA: Diagnosis not present

## 2014-12-10 DIAGNOSIS — I10 Essential (primary) hypertension: Secondary | ICD-10-CM

## 2014-12-10 LAB — MDC_IDC_ENUM_SESS_TYPE_INCLINIC
Battery Remaining Longevity: 24 mo
Lead Channel Impedance Value: 822 Ohm
Lead Channel Pacing Threshold Amplitude: 0.75 V
Lead Channel Pacing Threshold Pulse Width: 0.4 ms
Lead Channel Setting Pacing Amplitude: 2 V
Lead Channel Setting Sensing Sensitivity: 4 mV
MDC IDC MSMT BATTERY IMPEDANCE: 2011 Ohm
MDC IDC MSMT BATTERY VOLTAGE: 2.73 V
MDC IDC MSMT LEADCHNL RA IMPEDANCE VALUE: 503 Ohm
MDC IDC MSMT LEADCHNL RA PACING THRESHOLD AMPLITUDE: 0.75 V
MDC IDC MSMT LEADCHNL RA PACING THRESHOLD PULSEWIDTH: 0.4 ms
MDC IDC MSMT LEADCHNL RA SENSING INTR AMPL: 1.4 mV
MDC IDC SESS DTM: 20151221124749
MDC IDC SET LEADCHNL RV PACING AMPLITUDE: 2.5 V
MDC IDC SET LEADCHNL RV PACING PULSEWIDTH: 0.4 ms
MDC IDC STAT BRADY AP VP PERCENT: 28 %
MDC IDC STAT BRADY AP VS PERCENT: 0 %
MDC IDC STAT BRADY AS VP PERCENT: 72 %
MDC IDC STAT BRADY AS VS PERCENT: 0 %

## 2014-12-10 NOTE — Patient Instructions (Signed)
Your physician wants you to follow-up in: 12 months with Dr. Vallery Ridge will receive a reminder letter in the mail two months in advance. If you don't receive a letter, please call our office to schedule the follow-up appointment.   Remote monitoring is used to monitor your Pacemaker or ICD from home. This monitoring reduces the number of office visits required to check your device to one time per year. It allows [place] to keep an eye on the functioning of your device to ensure it is working properly. You are scheduled for a device check from home on 03/11/15. You may send your transmission at any time that day. If you have a wireless device, the transmission will be sent automatically. After your physician reviews your transmission, you will receive a postcard with your next transmission date.

## 2014-12-10 NOTE — Progress Notes (Signed)
PCP: Gennette Pac, MD Primary Cardiologist:  Dr Lynn Ito Scott [name] is a 73 y.o. male who presents today for routine electrophysiology followup.  Since last being seen in our clinic, the patient reports doing very well.  His primary concern is with chronic allergies and wheezing.  He says this is chronic and attributes to his prior uvuloplasty.  Today, he denies symptoms of palpitations, chest pain, shortness of breath,  lower extremity edema, dizziness, presyncope, or syncope.  The patient is otherwise without complaint today.   Past Medical History  Diagnosis Date  . Hyperlipidemia   . Obesity   . Atherosclerosis   . Kidney stones   . Complete heart block     s/p PPM following AVR  . Kidney calculus    Past Surgical History  Procedure Laterality Date  . Cardiac catheterization  03/28/2007    OVERALL CARDIAC SIZE AND SILHOUETTE WERE NORMAL. GLOBAL FUNCTION AND REGIONAL WALL WERE NORMAL  . Aortic valve replacement    . Aortic root replacement    . Insert / replace / remove pacemaker  03/2007    Current Outpatient Prescriptions  Medication Sig Dispense Refill  . aspirin EC 81 MG tablet Take 1 tablet (81 mg total) by mouth daily.    Marland Kitchen atorvastatin (LIPITOR) 20 MG tablet TAKE ONE TABLET BY MOUTH ONCE DAILY 90 tablet 3  . Cholecalciferol (VITAMIN D PO) Take 200 Units by mouth daily.      . DiphenhydrAMINE HCl, Sleep, (SIMPLY SLEEP PO) Take 1-2 tablets by mouth at bedtime as needed. For sleep    . fish oil-omega-3 fatty acids 1000 MG capsule Take 1 g by mouth daily.     . metoprolol tartrate (LOPRESSOR) 25 MG tablet TAKE ONE TABLET BY MOUTH TWICE DAILY 180 tablet 2  . PROAIR HFA 108 (90 BASE) MCG/ACT inhaler     . ranitidine (ZANTAC) 150 MG tablet Take 150 mg by mouth at bedtime as needed. For acid reflux    . timolol (TIMOPTIC) 0.5 % ophthalmic solution Place 1 drop into the right eye 2 (two) times daily.      No current facility-administered medications for this visit.     Physical Exam: Filed Vitals:   12/10/14 0939  BP: 122/72  Pulse: 61  Height: 5\' 10"  (1.778 m)  Weight: 214 lb 1.9 oz (97.124 kg)    GEN- The patient is well appearing, alert and oriented x 3 today.   Head- normocephalic, atraumatic Eyes-  Sclera clear, conjunctiva pink Ears- hearing intact Oropharynx- clear Lungs- Clear to ausculation bilaterally, normal work of breathing Chest- pacemaker pocket is well healed Heart- Regular rate and rhythm, no murmurs, rubs or gallops, PMI not laterally displaced GI- soft, NT, ND, + BS Extremities- no clubbing, cyanosis, or edema  Pacemaker interrogation- reviewed in detail today,  See PACEART report  Assessment and Plan:   AV BLOCK, COMPLETE  Normal pacemaker function  See Pace Art report  No changes today   HYPERTENSION  Stable  No change required today   carelink every 3 months Return to see me in 1 year

## 2015-01-30 DIAGNOSIS — L218 Other seborrheic dermatitis: Secondary | ICD-10-CM | POA: Diagnosis not present

## 2015-01-30 DIAGNOSIS — Z85828 Personal history of other malignant neoplasm of skin: Secondary | ICD-10-CM | POA: Diagnosis not present

## 2015-02-27 DIAGNOSIS — L820 Inflamed seborrheic keratosis: Secondary | ICD-10-CM | POA: Diagnosis not present

## 2015-02-27 DIAGNOSIS — L738 Other specified follicular disorders: Secondary | ICD-10-CM | POA: Diagnosis not present

## 2015-02-27 DIAGNOSIS — L718 Other rosacea: Secondary | ICD-10-CM | POA: Diagnosis not present

## 2015-02-27 DIAGNOSIS — L821 Other seborrheic keratosis: Secondary | ICD-10-CM | POA: Diagnosis not present

## 2015-02-27 DIAGNOSIS — Z85828 Personal history of other malignant neoplasm of skin: Secondary | ICD-10-CM | POA: Diagnosis not present

## 2015-03-11 ENCOUNTER — Telehealth: Payer: Self-pay | Admitting: Cardiology

## 2015-03-11 ENCOUNTER — Ambulatory Visit (INDEPENDENT_AMBULATORY_CARE_PROVIDER_SITE_OTHER): Payer: Medicare Other | Admitting: *Deleted

## 2015-03-11 DIAGNOSIS — I495 Sick sinus syndrome: Secondary | ICD-10-CM

## 2015-03-11 NOTE — Progress Notes (Signed)
Remote pacemaker transmission.   

## 2015-03-11 NOTE — Telephone Encounter (Signed)
Spoke with pt and reminded pt of remote transmission that is due today. Pt verbalized understanding.   

## 2015-03-12 LAB — MDC_IDC_ENUM_SESS_TYPE_REMOTE
Battery Impedance: 2178 Ohm
Battery Voltage: 2.71 V
Brady Statistic AP VP Percent: 31 %
Brady Statistic AS VP Percent: 69 %
Date Time Interrogation Session: 20160321155308
Lead Channel Pacing Threshold Amplitude: 0.75 V
Lead Channel Pacing Threshold Amplitude: 0.875 V
Lead Channel Sensing Intrinsic Amplitude: 1.4 mV
Lead Channel Setting Pacing Amplitude: 2.5 V
Lead Channel Setting Pacing Pulse Width: 0.4 ms
Lead Channel Setting Sensing Sensitivity: 4 mV
MDC IDC MSMT BATTERY REMAINING LONGEVITY: 22 mo
MDC IDC MSMT LEADCHNL RA IMPEDANCE VALUE: 518 Ohm
MDC IDC MSMT LEADCHNL RA PACING THRESHOLD PULSEWIDTH: 0.4 ms
MDC IDC MSMT LEADCHNL RV IMPEDANCE VALUE: 865 Ohm
MDC IDC MSMT LEADCHNL RV PACING THRESHOLD PULSEWIDTH: 0.4 ms
MDC IDC SET LEADCHNL RA PACING AMPLITUDE: 2 V
MDC IDC STAT BRADY AP VS PERCENT: 0 %
MDC IDC STAT BRADY AS VS PERCENT: 0 %

## 2015-03-28 ENCOUNTER — Encounter: Payer: Self-pay | Admitting: Cardiology

## 2015-04-01 ENCOUNTER — Encounter: Payer: Self-pay | Admitting: Internal Medicine

## 2015-04-03 DIAGNOSIS — E291 Testicular hypofunction: Secondary | ICD-10-CM | POA: Diagnosis not present

## 2015-04-03 DIAGNOSIS — Z125 Encounter for screening for malignant neoplasm of prostate: Secondary | ICD-10-CM | POA: Diagnosis not present

## 2015-04-03 DIAGNOSIS — R3915 Urgency of urination: Secondary | ICD-10-CM | POA: Diagnosis not present

## 2015-04-03 DIAGNOSIS — N400 Enlarged prostate without lower urinary tract symptoms: Secondary | ICD-10-CM | POA: Diagnosis not present

## 2015-04-11 DIAGNOSIS — E291 Testicular hypofunction: Secondary | ICD-10-CM | POA: Diagnosis not present

## 2015-04-11 DIAGNOSIS — R3915 Urgency of urination: Secondary | ICD-10-CM | POA: Diagnosis not present

## 2015-04-11 DIAGNOSIS — N5201 Erectile dysfunction due to arterial insufficiency: Secondary | ICD-10-CM | POA: Diagnosis not present

## 2015-04-11 DIAGNOSIS — N400 Enlarged prostate without lower urinary tract symptoms: Secondary | ICD-10-CM | POA: Diagnosis not present

## 2015-06-06 DIAGNOSIS — H4011X3 Primary open-angle glaucoma, severe stage: Secondary | ICD-10-CM | POA: Diagnosis not present

## 2015-06-12 ENCOUNTER — Encounter: Payer: Self-pay | Admitting: Internal Medicine

## 2015-06-12 ENCOUNTER — Ambulatory Visit (INDEPENDENT_AMBULATORY_CARE_PROVIDER_SITE_OTHER): Payer: Medicare Other | Admitting: *Deleted

## 2015-06-12 ENCOUNTER — Telehealth: Payer: Self-pay | Admitting: Cardiology

## 2015-06-12 DIAGNOSIS — I495 Sick sinus syndrome: Secondary | ICD-10-CM

## 2015-06-12 NOTE — Telephone Encounter (Signed)
Spoke with pt and reminded pt of remote transmission that is due today. Pt verbalized understanding.   

## 2015-06-12 NOTE — Progress Notes (Signed)
Remote pacemaker transmission.   

## 2015-06-16 LAB — CUP PACEART REMOTE DEVICE CHECK
Battery Impedance: 2251 Ohm
Battery Remaining Longevity: 22 mo
Battery Voltage: 2.71 V
Brady Statistic AP VP Percent: 33 %
Brady Statistic AP VS Percent: 0 %
Brady Statistic AS VS Percent: 0 %
Date Time Interrogation Session: 20160622162736
Lead Channel Impedance Value: 518 Ohm
Lead Channel Impedance Value: 888 Ohm
Lead Channel Pacing Threshold Pulse Width: 0.4 ms
Lead Channel Pacing Threshold Pulse Width: 0.4 ms
Lead Channel Setting Pacing Amplitude: 2.5 V
Lead Channel Setting Sensing Sensitivity: 4 mV
MDC IDC MSMT LEADCHNL RA PACING THRESHOLD AMPLITUDE: 0.875 V
MDC IDC MSMT LEADCHNL RA SENSING INTR AMPL: 1.4 mV
MDC IDC MSMT LEADCHNL RV PACING THRESHOLD AMPLITUDE: 0.75 V
MDC IDC SET LEADCHNL RA PACING AMPLITUDE: 2 V
MDC IDC SET LEADCHNL RV PACING PULSEWIDTH: 0.4 ms
MDC IDC STAT BRADY AS VP PERCENT: 67 %

## 2015-06-19 ENCOUNTER — Encounter: Payer: Self-pay | Admitting: Cardiology

## 2015-06-28 ENCOUNTER — Emergency Department (HOSPITAL_COMMUNITY): Payer: Medicare Other

## 2015-06-28 ENCOUNTER — Encounter (HOSPITAL_COMMUNITY): Payer: Self-pay | Admitting: Emergency Medicine

## 2015-06-28 ENCOUNTER — Emergency Department (HOSPITAL_COMMUNITY)
Admission: EM | Admit: 2015-06-28 | Discharge: 2015-06-28 | Disposition: A | Discharge status: Home / Self Care | Payer: Medicare Other | Attending: Emergency Medicine | Admitting: Emergency Medicine

## 2015-06-28 DIAGNOSIS — Z87891 Personal history of nicotine dependence: Secondary | ICD-10-CM | POA: Diagnosis not present

## 2015-06-28 DIAGNOSIS — Z9889 Other specified postprocedural states: Secondary | ICD-10-CM | POA: Diagnosis not present

## 2015-06-28 DIAGNOSIS — Z950 Presence of cardiac pacemaker: Secondary | ICD-10-CM | POA: Diagnosis not present

## 2015-06-28 DIAGNOSIS — N201 Calculus of ureter: Secondary | ICD-10-CM | POA: Insufficient documentation

## 2015-06-28 DIAGNOSIS — E669 Obesity, unspecified: Secondary | ICD-10-CM | POA: Diagnosis not present

## 2015-06-28 DIAGNOSIS — Z7982 Long term (current) use of aspirin: Secondary | ICD-10-CM | POA: Diagnosis not present

## 2015-06-28 DIAGNOSIS — N132 Hydronephrosis with renal and ureteral calculous obstruction: Secondary | ICD-10-CM | POA: Diagnosis not present

## 2015-06-28 DIAGNOSIS — E785 Hyperlipidemia, unspecified: Secondary | ICD-10-CM | POA: Diagnosis not present

## 2015-06-28 DIAGNOSIS — Z79899 Other long term (current) drug therapy: Secondary | ICD-10-CM | POA: Diagnosis not present

## 2015-06-28 DIAGNOSIS — K8020 Calculus of gallbladder without cholecystitis without obstruction: Secondary | ICD-10-CM | POA: Diagnosis not present

## 2015-06-28 DIAGNOSIS — Z8679 Personal history of other diseases of the circulatory system: Secondary | ICD-10-CM | POA: Diagnosis not present

## 2015-06-28 DIAGNOSIS — Z87442 Personal history of urinary calculi: Secondary | ICD-10-CM | POA: Insufficient documentation

## 2015-06-28 DIAGNOSIS — K5730 Diverticulosis of large intestine without perforation or abscess without bleeding: Secondary | ICD-10-CM | POA: Diagnosis not present

## 2015-06-28 DIAGNOSIS — R109 Unspecified abdominal pain: Secondary | ICD-10-CM | POA: Diagnosis present

## 2015-06-28 LAB — BASIC METABOLIC PANEL
Anion gap: 7 (ref 5–15)
BUN: 16 mg/dL (ref 6–20)
CALCIUM: 9.3 mg/dL (ref 8.9–10.3)
CO2: 28 mmol/L (ref 22–32)
CREATININE: 1.22 mg/dL (ref 0.61–1.24)
Chloride: 102 mmol/L (ref 101–111)
GFR calc non Af Amer: 56 mL/min — ABNORMAL LOW (ref >60)
Glucose, Bld: 124 mg/dL — ABNORMAL HIGH (ref 65–99)
POTASSIUM: 4.6 mmol/L (ref 3.5–5.1)
Sodium: 137 mmol/L (ref 135–145)

## 2015-06-28 MED ORDER — PROMETHAZINE HCL 25 MG/ML IJ SOLN
12.5000 mg | Freq: Once | INTRAMUSCULAR | Status: AC
  Administered 2015-06-28: 12.5 mg via INTRAVENOUS
  Filled 2015-06-28: qty 1

## 2015-06-28 MED ORDER — HYDROMORPHONE HCL 1 MG/ML IJ SOLN
0.5000 mg | Freq: Once | INTRAMUSCULAR | Status: AC
  Administered 2015-06-28: 0.5 mg via INTRAVENOUS
  Filled 2015-06-28: qty 1

## 2015-06-28 MED ORDER — HYDROMORPHONE HCL 1 MG/ML IJ SOLN
1.0000 mg | Freq: Once | INTRAMUSCULAR | Status: AC
  Administered 2015-06-28: 1 mg via INTRAVENOUS
  Filled 2015-06-28: qty 1

## 2015-06-28 MED ORDER — TAMSULOSIN HCL 0.4 MG PO CAPS: 0.4000 mg | ORAL_CAPSULE | Freq: Every day | ORAL | Status: DC

## 2015-06-28 MED ORDER — KETOROLAC TROMETHAMINE 15 MG/ML IJ SOLN
15.0000 mg | Freq: Once | INTRAMUSCULAR | Status: AC
  Administered 2015-06-28: 15 mg via INTRAVENOUS
  Filled 2015-06-28: qty 1

## 2015-06-28 MED ORDER — OXYCODONE-ACETAMINOPHEN 5-325 MG PO TABS: 1.0000 | ORAL_TABLET | ORAL | Status: DC | PRN

## 2015-06-28 MED ORDER — SODIUM CHLORIDE 0.9 % IV BOLUS (SEPSIS)
500.0000 mL | Freq: Once | INTRAVENOUS | Status: AC
  Administered 2015-06-28: 500 mL via INTRAVENOUS

## 2015-06-28 MED ORDER — ONDANSETRON 8 MG PO TBDP: 8.0000 mg | ORAL_TABLET | Freq: Three times a day (TID) | ORAL | Status: DC | PRN

## 2015-06-28 MED ORDER — ONDANSETRON HCL 4 MG/2ML IJ SOLN
4.0000 mg | Freq: Once | INTRAMUSCULAR | Status: AC
  Administered 2015-06-28: 4 mg via INTRAVENOUS
  Filled 2015-06-28: qty 2

## 2015-06-28 NOTE — Discharge Instructions (Signed)
Kidney Stones  Kidney stones (urolithiasis) are deposits that form inside your kidneys. The intense pain is caused by the stone moving through the urinary tract. When the stone moves, the ureter goes into spasm around the stone. The stone is usually passed in the urine.   CAUSES   · A disorder that makes certain neck glands produce too much parathyroid hormone (primary hyperparathyroidism).  · A buildup of uric acid crystals, similar to gout in your joints.  · Narrowing (stricture) of the ureter.  · A kidney obstruction present at birth (congenital obstruction).  · Previous surgery on the kidney or ureters.  · Numerous kidney infections.  SYMPTOMS   · Feeling sick to your stomach (nauseous).  · Throwing up (vomiting).  · Blood in the urine (hematuria).  · Pain that usually spreads (radiates) to the groin.  · Frequency or urgency of urination.  DIAGNOSIS   · Taking a history and physical exam.  · Blood or urine tests.  · CT scan.  · Occasionally, an examination of the inside of the urinary bladder (cystoscopy) is performed.  TREATMENT   · Observation.  · Increasing your fluid intake.  · Extracorporeal shock wave lithotripsy--This is a noninvasive procedure that uses shock waves to break up kidney stones.  · Surgery may be needed if you have severe pain or persistent obstruction. There are various surgical procedures. Most of the procedures are performed with the use of small instruments. Only small incisions are needed to accommodate these instruments, so recovery time is minimized.  The size, location, and chemical composition are all important variables that will determine the proper choice of action for you. Talk to your health care provider to better understand your situation so that you will minimize the risk of injury to yourself and your kidney.   HOME CARE INSTRUCTIONS   · Drink enough water and fluids to keep your urine clear or pale yellow. This will help you to pass the stone or stone fragments.  · Strain  all urine through the provided strainer. Keep all particulate matter and stones for your health care provider to see. The stone causing the pain may be as small as a grain of salt. It is very important to use the strainer each and every time you pass your urine. The collection of your stone will allow your health care provider to analyze it and verify that a stone has actually passed. The stone analysis will often identify what you can do to reduce the incidence of recurrences.  · Only take over-the-counter or prescription medicines for pain, discomfort, or fever as directed by your health care provider.  · Make a follow-up appointment with your health care provider as directed.  · Get follow-up X-rays if required. The absence of pain does not always mean that the stone has passed. It may have only stopped moving. If the urine remains completely obstructed, it can cause loss of kidney function or even complete destruction of the kidney. It is your responsibility to make sure X-rays and follow-ups are completed. Ultrasounds of the kidney can show blockages and the status of the kidney. Ultrasounds are not associated with any radiation and can be performed easily in a matter of minutes.  SEEK MEDICAL CARE IF:  · You experience pain that is progressive and unresponsive to any pain medicine you have been prescribed.  SEEK IMMEDIATE MEDICAL CARE IF:   · Pain cannot be controlled with the prescribed medicine.  · You have a fever or   shaking chills.  · The severity or intensity of pain increases over 18 hours and is not relieved by pain medicine.  · You develop a new onset of abdominal pain.  · You feel faint or pass out.  · You are unable to urinate.  MAKE SURE YOU:   · Understand these instructions.  · Will watch your condition.  · Will get help right away if you are not doing well or get worse.  Document Released: 12/07/2005 Document Revised: 08/09/2013 Document Reviewed: 05/10/2013  ExitCare® Patient Information ©2015  [employer]. This information is not intended to replace advice given to you by your health care provider. Make sure you discuss any questions you have with your health care provider.    Dietary Guidelines to Help Prevent Kidney Stones  Your risk of kidney stones can be decreased by adjusting the foods you eat. The most important thing you can do is drink enough fluid. You should drink enough fluid to keep your urine clear or pale yellow. The following guidelines provide specific information for the type of kidney stone you have had.  GUIDELINES ACCORDING TO TYPE OF KIDNEY STONE  Calcium Oxalate Kidney Stones  · Reduce the amount of salt you eat. Foods that have a lot of salt cause your body to release excess calcium into your urine. The excess calcium can combine with a substance called oxalate to form kidney stones.  · Reduce the amount of animal protein you eat if the amount you eat is excessive. Animal protein causes your body to release excess calcium into your urine. Ask your dietitian how much protein from animal sources you should be eating.  · Avoid foods that are high in oxalates. If you take vitamins, they should have less than 500 mg of vitamin C. Your body turns vitamin C into oxalates. You do not need to avoid fruits and vegetables high in vitamin C.  Calcium Phosphate Kidney Stones  · Reduce the amount of salt you eat to help prevent the release of excess calcium into your urine.  · Reduce the amount of animal protein you eat if the amount you eat is excessive. Animal protein causes your body to release excess calcium into your urine. Ask your dietitian how much protein from animal sources you should be eating.  · Get enough calcium from food or take a calcium supplement (ask your dietitian for recommendations). Food sources of calcium that do not increase your risk of kidney stones include:  ¨ Broccoli.  ¨ Dairy products, such as cheese and yogurt.  ¨ Pudding.  Uric Acid Kidney Stones  · Do not  have more than 6 oz of animal protein per day.  FOOD SOURCES  Animal Protein Sources  · Meat (all types).  · Poultry.  · Eggs.  · Fish, seafood.  Foods High in Salt  · Salt seasonings.  · Soy sauce.  · Teriyaki sauce.  · Cured and processed meats.  · Salted crackers and snack foods.  · Fast food.  · Canned soups and most canned foods.  Foods High in Oxalates  · Grains:  ¨ Amaranth.  ¨ Barley.  ¨ Grits.  ¨ Wheat germ.  ¨ Bran.  ¨ Buckwheat flour.  ¨ All bran cereals.  ¨ Pretzels.  ¨ Whole wheat bread.  · Vegetables:  ¨ Beans (wax).  ¨ Beets and beet greens.  ¨ Collard greens.  ¨ Eggplant.  ¨ Escarole.  ¨ Leeks.  ¨ Okra.  ¨ Parsley.  ¨ Rutabagas.  ¨   Spinach.  ¨ Swiss chard.  ¨ Tomato paste.  ¨ Fried potatoes.  ¨ Sweet potatoes.  · Fruits:  ¨ Red currants.  ¨ Figs.  ¨ Kiwi.  ¨ Rhubarb.  · Meat and Other Protein Sources:  ¨ Beans (dried).  ¨ Soy burgers and other soybean products.  ¨ Miso.  ¨ Nuts (peanuts, almonds, pecans, cashews, hazelnuts).  ¨ Nut butters.  ¨ Sesame seeds and tahini (paste made of sesame seeds).  ¨ Poppy seeds.  · Beverages:  ¨ Chocolate drink mixes.  ¨ Soy milk.  ¨ Instant iced tea.  ¨ Juices made from high-oxalate fruits or vegetables.  · Other:  ¨ Carob.  ¨ Chocolate.  ¨ Fruitcake.  ¨ Marmalades.  Document Released: 04/03/2011 Document Revised: 12/12/2013 Document Reviewed: 11/03/2013  ExitCare® Patient Information ©2015 [employer]. This information is not intended to replace advice given to you by your health care provider. Make sure you discuss any questions you have with your health care provider.

## 2015-06-28 NOTE — ED Provider Notes (Signed)
Patient presented to the ER with sudden onset severe right flank pain. Symptoms similar to previous kidney stones.  Face to face Exam: HEENT - PERRLA Lungs - CTAB Heart - RRR, no M/R/G Abd - S/NT/ND Neuro - alert, oriented x3  Plan: Analgesia, rule out infected stone, follow up urology.  Orpah Greek, MD 06/28/15 [phone]

## 2015-06-28 NOTE — ED Provider Notes (Signed)
CSN: [id number]     Arrival date & time 06/28/15  1606 History   First MD Initiated Contact with Patient 06/28/15 1616     Chief Complaint  Patient presents with  . Flank Pain     (Consider location/radiation/quality/duration/timing/severity/associated sxs/prior Treatment) Patient is a 74 y.o. male presenting with flank pain. The history is provided by the patient. No language interpreter was used.  Flank Pain This is a new problem. The current episode started today. The problem occurs constantly. Pertinent negatives include no abdominal pain, chills or fever. Associated symptoms comments: Sudden onset pain in the right flank starting 1-2 hours prior to arrival associated with nausea and vomiting. No fever. He has a history of kidney stones and reports "I know that is what this is." He denies hematuria..    Past Medical History  Diagnosis Date  . Hyperlipidemia   . Obesity   . Atherosclerosis   . Kidney stones   . Complete heart block     s/p PPM following AVR  . Kidney calculus   . Pacemaker    Past Surgical History  Procedure Laterality Date  . Cardiac catheterization  03/28/2007    OVERALL CARDIAC SIZE AND SILHOUETTE WERE NORMAL. GLOBAL FUNCTION AND REGIONAL WALL WERE NORMAL  . Aortic valve replacement    . Aortic root replacement    . Insert / replace / remove pacemaker  03/2007   Family History  Problem Relation Age of Onset  . Heart attack Father    History  Substance Use Topics  . Smoking status: Former Smoker -- 1.50 packs/day for 30 years    Types: Cigarettes    Quit date: 10/30/1984  . Smokeless tobacco: Never Used  . Alcohol Use: No    Review of Systems  Constitutional: Negative for fever and chills.  Respiratory: Negative.   Cardiovascular: Negative.   Gastrointestinal: Negative.  Negative for abdominal pain.  Genitourinary: Positive for flank pain. Negative for hematuria.  Musculoskeletal: Negative.   Skin: Negative.   Neurological: Negative.        Allergies  Clindamycin/lincomycin and Morphine  Home Medications   Prior to Admission medications   Medication Sig Start Date End Date Taking? Authorizing Provider  aspirin EC 81 MG tablet Take 1 tablet (81 mg total) by mouth daily. 01/14/12   Larey Dresser, MD  atorvastatin (LIPITOR) 20 MG tablet TAKE ONE TABLET BY MOUTH ONCE DAILY 09/24/14   Larey Dresser, MD  Cholecalciferol (VITAMIN D PO) Take 200 Units by mouth daily.      Historical Provider, MD  DiphenhydrAMINE HCl, Sleep, (SIMPLY SLEEP PO) Take 1-2 tablets by mouth at bedtime as needed. For sleep    Historical Provider, MD  fish oil-omega-3 fatty acids 1000 MG capsule Take 1 g by mouth daily.     Historical Provider, MD  metoprolol tartrate (LOPRESSOR) 25 MG tablet TAKE ONE TABLET BY MOUTH TWICE DAILY 11/05/14   Larey Dresser, MD  PROAIR HFA 108 [phone] BASE) MCG/ACT inhaler  10/19/14   Historical Provider, MD  ranitidine (ZANTAC) 150 MG tablet Take 150 mg by mouth at bedtime as needed. For acid reflux    Historical Provider, MD  timolol (TIMOPTIC) 0.5 % ophthalmic solution Place 1 drop into the right eye 2 (two) times daily.  11/27/11   Historical Provider, MD   BP 160/70 mmHg  Pulse 61  Temp(Src) 97.6 F (36.4 C) (Oral)  Resp 18  Ht 5\' 10"  (1.778 m)  Wt 207 lb (93.895 kg)  BMI 29.70 kg/m2  SpO2 96% Physical Exam  Constitutional: He is oriented to person, place, and time. He appears well-developed and well-nourished.  Neck: Normal range of motion.  Pulmonary/Chest: Effort normal.  Abdominal: He exhibits no distension.  Musculoskeletal: Normal range of motion.  Neurological: He is alert and oriented to person, place, and time.  Skin: Skin is warm and dry.  Psychiatric: He has a normal mood and affect.    ED Course  Procedures (including critical care time) Labs Review Labs Reviewed - No data to display Results for orders placed or performed during the hospital encounter of 66/81/59  Basic metabolic panel   Result Value Ref Range   Sodium 137 135 - 145 mmol/L   Potassium 4.6 3.5 - 5.1 mmol/L   Chloride 102 101 - 111 mmol/L   CO2 28 22 - 32 mmol/L   Glucose, Bld 124 (H) 65 - 99 mg/dL   BUN 16 6 - 20 mg/dL   Creatinine, Ser 1.22 0.61 - 1.24 mg/dL   Calcium 9.3 8.9 - 10.3 mg/dL   GFR calc non Af Amer 56 (L) >60 mL/min   GFR calc Af Amer >60 >60 mL/min   Anion gap 7 5 - 15   Ct Renal Stone Study  06/28/2015   CLINICAL DATA:  74 year old male with right flank pain  EXAM: CT ABDOMEN AND PELVIS WITHOUT CONTRAST  TECHNIQUE: Multidetector CT imaging of the abdomen and pelvis was performed following the standard protocol without IV contrast.  COMPARISON:  Renal ultrasound dated 01/26/2013 and CT dated 01/09/2013  FINDINGS: Evaluation of this exam is limited in the absence of intravenous contrast.  The visualized lung bases are clear. There is coronary vascular calcification. Cardiac pacemaker leads  No intra-abdominal free air or free fluid.  Small stones at the neck of the gallbladder. No pericholecystic fluid. The liver, pancreas, spleen, adrenal glands appear unremarkable. There is a 3 mm right UPJ stone with mild right hydronephrosis. There is no hydronephrosis or nephrolithiasis on the left. Multiple small left renal parapelvic cysts noted. A 1.8 cm exophytic hypodense lesion from the anterior cortex of the inferior pole of the left kidney is not well characterized. Ultrasound may provide better evaluation. The urinary bladder is grossly unremarkable. There are coarse calcification of the prostate gland.  There is extensive sigmoid diverticulosis without active inflammation. No evidence of bowel obstruction. Normal appendix.  There is aortoiliac atherosclerotic disease. There is mild ectasia of the abdominal aorta. There is no lymphadenopathy. Small fat containing bilateral inguinal hernias. Degenerative changes of the spine. No acute fracture.  IMPRESSION: A 3 mm right UPJ stone with mild right  hydronephrosis.  Sigmoid diverticulosis.  Cholelithiasis.   Electronically Signed   By: Anner Crete M.D.   On: 06/28/2015 18:10    Imaging Review No results found.   EKG Interpretation None      MDM   Final diagnoses:  None    1. Kidney stone, right   5:30:  Pain returning after brief period of relief. Nausea improved but persistent. Waiting for CT scan.  7:20: he is comfortable, no further pain. No vomiting/nausea. 3 mm right UVJ stone seen on CT. VSS. He has urology follow up with [facility] (Dr. Tresa Moore). Return precautions discussed.  Charlann Lange, PA-C 06/28/15 [place], MD 06/28/15 Joen Laura

## 2015-06-28 NOTE — ED Notes (Addendum)
Pt presents to ED c/o right flank pain rated 5/10 and continually sharp since about 3pm this afternoon.  He has hx of kidney stones and states that this feels the same.  No meds taken although he did try vinegar and olive oil as a home remedy with no relief. Pt has vomited 3-5 times since his pain started.

## 2015-06-28 NOTE — ED Notes (Signed)
Bed: WA21 Expected date:  Expected time:  Means of arrival:  Comments: EMS/48F/abd pain/stomach ca

## 2015-06-28 NOTE — ED Notes (Signed)
Urine strainer given to pt.

## 2015-08-01 ENCOUNTER — Ambulatory Visit (INDEPENDENT_AMBULATORY_CARE_PROVIDER_SITE_OTHER): Payer: Medicare Other | Admitting: Podiatry

## 2015-08-01 ENCOUNTER — Encounter: Payer: Self-pay | Admitting: Podiatry

## 2015-08-01 ENCOUNTER — Ambulatory Visit (INDEPENDENT_AMBULATORY_CARE_PROVIDER_SITE_OTHER): Payer: Medicare Other

## 2015-08-01 VITALS — BP 116/67 | HR 61 | Resp 18

## 2015-08-01 DIAGNOSIS — M2040 Other hammer toe(s) (acquired), unspecified foot: Secondary | ICD-10-CM

## 2015-08-01 DIAGNOSIS — D169 Benign neoplasm of bone and articular cartilage, unspecified: Secondary | ICD-10-CM

## 2015-08-01 DIAGNOSIS — L84 Corns and callosities: Secondary | ICD-10-CM | POA: Diagnosis not present

## 2015-08-01 DIAGNOSIS — M79673 Pain in unspecified foot: Secondary | ICD-10-CM | POA: Diagnosis not present

## 2015-08-01 NOTE — Progress Notes (Signed)
   Subjective:    Patient ID: Scott [name], male    DOB: Jan 24, 1941, 74 y.o.   MRN: [id number]  HPI I HAVE SOME PLACES IN BETWEEN MY 4TH AND 5TH TOES ON MY LEFT FOOT AND DOES NOT DRAIN AND BURNS AND THROBS AND HURTS TO STEP    Review of Systems  All other systems reviewed and are negative.      Objective:   Physical Exam        Assessment & Plan:

## 2015-08-01 NOTE — Progress Notes (Signed)
Subjective:     Patient ID: JAION LAGRANGE, male   DOB: 06-Mar-1941, 74 y.o.   MRN: [id number]  HPI patient states I have some painful corns on my fourth and fifth toe left over right that I am having trouble wearing shoe gear or walking with. I've tried to trim them and pad them without relief of symptoms and they been present for around 4 months   Review of Systems  All other systems reviewed and are negative.      Objective:   Physical Exam  Constitutional: He is oriented to person, place, and time.  Cardiovascular: Intact distal pulses.   Musculoskeletal: Normal range of motion.  Neurological: He is oriented to person, place, and time.  Skin: Skin is warm and dry.  Nursing note and vitals reviewed.  neurovascular status was found to be intact with muscle strength adequate range of motion mildly reduced subtalar midtarsal joint. Patient is noted to have digital rotation of the fifth digit bilateral with distal medial keratotic lesion fifth digit left over right with pain when palpated. Lesions also noted on the fourth toe of both feet with the left being worse than the right. Patient has good digital perfusion and is well oriented 3     Assessment:     Hammertoe deformity fourth and fifth digit left over right with keratotic exostotic lesion fifth digit left over right foot    Plan:     H&P and x-rays reviewed with patient. Today aggressive debridement accomplished along with padding and I reviewed with him the considerations for surgery to include either exostectomy arthroplasty for both. He will be seen back when symptomatic again

## 2015-08-28 ENCOUNTER — Other Ambulatory Visit: Payer: Self-pay | Admitting: Cardiology

## 2015-09-03 DIAGNOSIS — D485 Neoplasm of uncertain behavior of skin: Secondary | ICD-10-CM | POA: Diagnosis not present

## 2015-09-03 DIAGNOSIS — Z85828 Personal history of other malignant neoplasm of skin: Secondary | ICD-10-CM | POA: Diagnosis not present

## 2015-09-03 DIAGNOSIS — L720 Epidermal cyst: Secondary | ICD-10-CM | POA: Diagnosis not present

## 2015-09-03 DIAGNOSIS — L570 Actinic keratosis: Secondary | ICD-10-CM | POA: Diagnosis not present

## 2015-09-03 DIAGNOSIS — D692 Other nonthrombocytopenic purpura: Secondary | ICD-10-CM | POA: Diagnosis not present

## 2015-09-03 DIAGNOSIS — L821 Other seborrheic keratosis: Secondary | ICD-10-CM | POA: Diagnosis not present

## 2015-09-03 DIAGNOSIS — L400 Psoriasis vulgaris: Secondary | ICD-10-CM | POA: Diagnosis not present

## 2015-09-16 ENCOUNTER — Telehealth: Payer: Self-pay | Admitting: Cardiology

## 2015-09-16 ENCOUNTER — Ambulatory Visit (INDEPENDENT_AMBULATORY_CARE_PROVIDER_SITE_OTHER): Payer: Medicare Other | Admitting: *Deleted

## 2015-09-16 DIAGNOSIS — I495 Sick sinus syndrome: Secondary | ICD-10-CM

## 2015-09-16 NOTE — Telephone Encounter (Signed)
Attempted to confirm remote transmission with pt. No answer and was unable to leave a message.   

## 2015-09-17 ENCOUNTER — Encounter: Payer: Self-pay | Admitting: Cardiology

## 2015-09-18 ENCOUNTER — Encounter: Payer: Self-pay | Admitting: Internal Medicine

## 2015-09-18 ENCOUNTER — Telehealth: Payer: Self-pay | Admitting: Internal Medicine

## 2015-09-18 DIAGNOSIS — I495 Sick sinus syndrome: Secondary | ICD-10-CM

## 2015-09-18 NOTE — Telephone Encounter (Signed)
New message    Patient having trouble with his remote device .

## 2015-09-18 NOTE — Telephone Encounter (Signed)
Returned patient's call.  Attempted to troubleshoot monitor but was unsuccessful.  Gave patient phone number for [employer] tech services so that they could further troubleshoot his Carelink monitor.  Patient was appreciative of return call and denies questions or concerns at this time.

## 2015-09-19 ENCOUNTER — Other Ambulatory Visit: Payer: Self-pay | Admitting: Cardiology

## 2015-09-19 DIAGNOSIS — I495 Sick sinus syndrome: Secondary | ICD-10-CM | POA: Diagnosis not present

## 2015-09-19 NOTE — Progress Notes (Signed)
Remote pacemaker transmission.   

## 2015-10-01 LAB — CUP PACEART REMOTE DEVICE CHECK
Battery Impedance: 2307 Ohm
Battery Remaining Longevity: 21 mo
Brady Statistic AP VP Percent: 32 %
Brady Statistic AP VS Percent: 0 %
Brady Statistic AS VP Percent: 68 %
Brady Statistic AS VS Percent: 0 %
Lead Channel Impedance Value: 515 Ohm
Lead Channel Impedance Value: 826 Ohm
Lead Channel Pacing Threshold Amplitude: 0.75 V
Lead Channel Pacing Threshold Amplitude: 0.875 V
Lead Channel Pacing Threshold Pulse Width: 0.4 ms
Lead Channel Sensing Intrinsic Amplitude: 1.4 mV
Lead Channel Setting Pacing Amplitude: 2 V
Lead Channel Setting Pacing Pulse Width: 0.4 ms
Lead Channel Setting Sensing Sensitivity: 4 mV
MDC IDC MSMT BATTERY VOLTAGE: 2.72 V
MDC IDC MSMT LEADCHNL RV PACING THRESHOLD PULSEWIDTH: 0.4 ms
MDC IDC SESS DTM: 20160928163848
MDC IDC SET LEADCHNL RV PACING AMPLITUDE: 2.5 V

## 2015-10-14 ENCOUNTER — Encounter: Payer: Self-pay | Admitting: Cardiology

## 2015-10-14 ENCOUNTER — Ambulatory Visit (INDEPENDENT_AMBULATORY_CARE_PROVIDER_SITE_OTHER): Payer: Medicare Other | Admitting: Cardiology

## 2015-10-14 VITALS — BP 128/64 | HR 60 | Ht 70.0 in | Wt 212.8 lb

## 2015-10-14 DIAGNOSIS — Z954 Presence of other heart-valve replacement: Secondary | ICD-10-CM

## 2015-10-14 DIAGNOSIS — E785 Hyperlipidemia, unspecified: Secondary | ICD-10-CM

## 2015-10-14 DIAGNOSIS — Z952 Presence of prosthetic heart valve: Secondary | ICD-10-CM

## 2015-10-14 DIAGNOSIS — I10 Essential (primary) hypertension: Secondary | ICD-10-CM | POA: Diagnosis not present

## 2015-10-14 LAB — LIPID PANEL
Cholesterol: 170 mg/dL (ref 125–200)
HDL: 38 mg/dL — AB (ref >=40)
LDL CALC: 84 mg/dL (ref <130)
TRIGLYCERIDES: 240 mg/dL — AB (ref <150)
Total CHOL/HDL Ratio: 4.5 Ratio (ref <=5.0)
VLDL: 48 mg/dL — AB (ref <30)

## 2015-10-14 NOTE — Patient Instructions (Signed)
Medication Instructions:  No changes today  Labwork: Lipid profile today  Testing/Procedures: None  Follow-Up: Your physician wants you to follow-up in: 1 year with Dr Aundra Dubin (October 2017).  You will receive a reminder letter in the mail two months in advance. If you don't receive a letter, please call our office to schedule the follow-up appointment.       If you need a refill on your cardiac medications before your next appointment, please call your pharmacy.

## 2015-10-15 NOTE — Progress Notes (Signed)
Patient ID: Scott [name], male   DOB: 04-03-1941, 74 y.o.   MRN: [id number] PCP: Dr. Lennette Bihari LIttle  74 yo with history of aortic valve replacement (bioprosthetic valve) and aortic root revision in 4/08 presents for cardiology followup.  He had a Medtronic dual chamber PCM placed post-operatively in 4/08 for complete heart block.  Echo in 8/15 showed EF 50-55% and well-seated bioprosthetic AoV.  No chest pain.  No exertional dyspnea.  Not getting much exercise.  He does do a fair amount of yardwork.    Labs (1/13): LDL 86, HDL 42, LFTs normal, K 4.4, creatinine 1.1 Labs (1/14): K 4.1, creatinine 1.1, LDL 100, HDL 40 Labs (10/14): LDL 111, HDL 40.5 Labs (8/15): LDL 84, HDL 46 Labs (7/16): K 4.67, creatinine 1.22  ECG: A-sensed (NSR), V-paced  PMH: 1. OSA: s/p uvuloplasty > 10 years ago 2. S/p post bioprosthetic aortic valve replacement with #27 [employer] FreeStyle valve in 4/08.  Also had aortic root revision.  Echo (1/13) with EF 50-55%, mild LVH, moderate diastolic dysfunction, bioprosthetic aortic valve with mean gradient 6 mmHg and mild AI, PA systolic pressure 37 mmHg.  Echo (8/15) with EF 50-55%, bioprosthetic aortic valve with mean gradient 5 mmHg, PASP 42 mmHg.  3. Post-op complete heart block with [employer] dual chamber PCM (4/08). 4. Hyperlipidemia 5. GERD 6. Cath pre-op in 2008 with mild atherosclerosis.  7. HTN 8. Nephrolithiasis  SH: Lives in [place], married, retired Chief Technology Officer.  Quit smoking in 1985.    FH: Father with MI in his late 13s.  ROS: All systems reviewed and negative except as per HPI.   Current Outpatient Prescriptions  Medication Sig Dispense Refill  . aspirin EC 81 MG tablet Take 1 tablet (81 mg total) by mouth daily.    Marland Kitchen atorvastatin (LIPITOR) 20 MG tablet TAKE ONE TABLET BY MOUTH ONCE DAILY 90 tablet 0  . calcipotriene (DOVONOX) 0.005 % ointment Apply 1 application topically 2 (two) times daily.     . Cholecalciferol (VITAMIN D PO) Take 2,000 Units  by mouth daily.     . DiphenhydrAMINE HCl, Sleep, (SIMPLY SLEEP PO) Take 1-2 tablets by mouth at bedtime as needed. For sleep    . fish oil-omega-3 fatty acids 1000 MG capsule Take 1 g by mouth daily.     . metoprolol tartrate (LOPRESSOR) 25 MG tablet TAKE ONE TABLET BY MOUTH TWICE DAILY 180 tablet 0  . oxyCODONE-acetaminophen (PERCOCET/ROXICET) 5-325 MG per tablet Take 1-2 tablets by mouth every 4 (four) hours as needed for severe pain. 15 tablet 0  . PROAIR HFA 108 (90 BASE) MCG/ACT inhaler Inhale 2 puffs into the lungs every 6 (six) hours as needed.     . ranitidine (ZANTAC) 150 MG tablet Take 150 mg by mouth at bedtime as needed. For acid reflux    . timolol (TIMOPTIC) 0.5 % ophthalmic solution Place 1 drop into the right eye 2 (two) times daily.      No current facility-administered medications for this visit.    BP 128/64 mmHg  Pulse 60  Ht 5\' 10"  (1.778 m)  Wt 212 lb 12.8 oz (96.525 kg)  BMI 30.53 kg/m2 General: NAD, overweight Neck: Thick, no JVD, no thyromegaly or thyroid nodule.  Lungs: Clear to auscultation bilaterally with normal respiratory effort. CV: Nondisplaced PMI.  Heart regular S1/S2, no S3/S4, 1/6 SEM.  No edema.  No carotid bruit.  Normal pedal pulses.  Abdomen: Soft, nontender, no hepatosplenomegaly, no distention.  Neurologic: Alert and oriented x 3.  Psych: Normal affect. Extremities: No clubbing or cyanosis.   Assessment/Plan: 1. Bioprosthetic aortic valve replacement and root revision: Patient is doing well symptomatically.  Last echo in 8/15 showed a well-seated valve.  Needs prophylactic antibiotics with dental work.  2. Hyperlipidemia: Check lipids today.  3. Complete heart block: Has Medtronic PCM.   Followup in 1 year.   Loralie Champagne 10/15/2015

## 2015-11-01 ENCOUNTER — Encounter: Payer: Self-pay | Admitting: Cardiology

## 2015-11-05 DIAGNOSIS — Z23 Encounter for immunization: Secondary | ICD-10-CM | POA: Diagnosis not present

## 2015-12-01 ENCOUNTER — Other Ambulatory Visit: Payer: Self-pay | Admitting: Cardiology

## 2015-12-17 ENCOUNTER — Other Ambulatory Visit: Payer: Self-pay | Admitting: Cardiology

## 2015-12-19 ENCOUNTER — Ambulatory Visit: Payer: Medicare Other | Admitting: Podiatry

## 2015-12-25 ENCOUNTER — Ambulatory Visit (INDEPENDENT_AMBULATORY_CARE_PROVIDER_SITE_OTHER): Payer: Medicare Other | Admitting: Internal Medicine

## 2015-12-25 ENCOUNTER — Encounter: Payer: Self-pay | Admitting: Internal Medicine

## 2015-12-25 VITALS — BP 128/84 | HR 62 | Ht 70.0 in | Wt 211.6 lb

## 2015-12-25 DIAGNOSIS — I10 Essential (primary) hypertension: Secondary | ICD-10-CM | POA: Diagnosis not present

## 2015-12-25 DIAGNOSIS — I442 Atrioventricular block, complete: Secondary | ICD-10-CM

## 2015-12-25 NOTE — Progress Notes (Signed)
PCP: Gennette Pac, MD Primary Cardiologist:  Dr Lynn Ito Scott [name] is a 75 y.o. male who presents today for routine electrophysiology followup.  Since last being seen in our clinic, the patient reports doing very well.    Today, he denies symptoms of palpitations, chest pain, shortness of breath,  lower extremity edema, dizziness, presyncope, or syncope.  The patient is otherwise without complaint today.   Past Medical History  Diagnosis Date  . Hyperlipidemia   . Obesity   . Atherosclerosis   . Kidney stones   . Complete heart block (HCC)     s/p PPM following AVR  . Kidney calculus   . Pacemaker    Past Surgical History  Procedure Laterality Date  . Cardiac catheterization  03/28/2007    OVERALL CARDIAC SIZE AND SILHOUETTE WERE NORMAL. GLOBAL FUNCTION AND REGIONAL WALL WERE NORMAL  . Aortic valve replacement    . Aortic root replacement    . Insert / replace / remove pacemaker  03/2007    Current Outpatient Prescriptions  Medication Sig Dispense Refill  . aspirin EC 81 MG tablet Take 1 tablet (81 mg total) by mouth daily.    Marland Kitchen atorvastatin (LIPITOR) 20 MG tablet TAKE ONE TABLET BY MOUTH ONCE DAILY 90 tablet 2  . calcipotriene (DOVONOX) 0.005 % ointment Apply 1 application topically 2 (two) times daily.     . Cholecalciferol (VITAMIN D PO) Take 2,000 Units by mouth daily.     . DiphenhydrAMINE HCl, Sleep, (SIMPLY SLEEP PO) Take 1-2 tablets by mouth at bedtime as needed. For sleep    . fish oil-omega-3 fatty acids 1000 MG capsule Take 1 g by mouth daily.     . metoprolol tartrate (LOPRESSOR) 25 MG tablet TAKE ONE TABLET BY MOUTH TWICE DAILY 180 tablet 3  . oxyCODONE-acetaminophen (PERCOCET/ROXICET) 5-325 MG per tablet Take 1-2 tablets by mouth every 4 (four) hours as needed for severe pain. 15 tablet 0  . PROAIR HFA 108 (90 BASE) MCG/ACT inhaler Inhale 2 puffs into the lungs every 6 (six) hours as needed for wheezing or shortness of breath.     . ranitidine (ZANTAC) 150 MG  tablet Take 150 mg by mouth at bedtime as needed. For acid reflux    . timolol (TIMOPTIC) 0.5 % ophthalmic solution Place 1 drop into the right eye 2 (two) times daily.      No current facility-administered medications for this visit.    Physical Exam: Filed Vitals:   12/25/15 0944  BP: 128/84  Pulse: 62  Height: 5\' 10"  (1.778 m)  Weight: 211 lb 9.6 oz (95.981 kg)    GEN- The patient is well appearing, alert and oriented x 3 today.   Head- normocephalic, atraumatic Eyes-  Sclera clear, conjunctiva pink Ears- hearing intact Oropharynx- clear Lungs- Clear to ausculation bilaterally, normal work of breathing Chest- pacemaker pocket is well healed Heart- Regular rate and rhythm, no murmurs, rubs or gallops, PMI not laterally displaced GI- soft, NT, ND, + BS Extremities- no clubbing, cyanosis, or edema  Pacemaker interrogation- reviewed in detail today,  See PACEART report  Assessment and Plan:   AV BLOCK, COMPLETE  Normal pacemaker function  See Pace Art report  No changes today   HYPERTENSION  Stable  No change required today   carelink every 3 months Return to see me in 1 year  Thompson Grayer MD, [facility] 12/25/2015 10:14 AM

## 2015-12-25 NOTE — Patient Instructions (Signed)
Medication Instructions:  Your physician recommends that you continue on your current medications as directed. Please refer to the Current Medication list given to you today.    Labwork: None ordered   Testing/Procedures: None ordered   Follow-Up: Remote monitoring is used to monitor your Pacemaker from home. This monitoring reduces the number of office visits required to check your device to one time per year. It allows [place] to keep an eye on the functioning of your device to ensure it is working properly. You are scheduled for a device check from home on 03/25/16. You may send your transmission at any time that day. If you have a wireless device, the transmission will be sent automatically. After your physician reviews your transmission, you will receive a postcard with your next transmission date.  Your physician wants you to follow-up in: 12 months with Dr Vallery Ridge will receive a reminder letter in the mail two months in advance. If you don't receive a letter, please call our office to schedule the follow-up appointment.     Any Other Special Instructions Will Be Listed Below (If Applicable).     If you need a refill on your cardiac medications before your next appointment, please call your pharmacy.

## 2016-01-02 DIAGNOSIS — H401113 Primary open-angle glaucoma, right eye, severe stage: Secondary | ICD-10-CM | POA: Diagnosis not present

## 2016-01-02 DIAGNOSIS — H524 Presbyopia: Secondary | ICD-10-CM | POA: Diagnosis not present

## 2016-03-24 DIAGNOSIS — L400 Psoriasis vulgaris: Secondary | ICD-10-CM | POA: Diagnosis not present

## 2016-03-24 DIAGNOSIS — D1801 Hemangioma of skin and subcutaneous tissue: Secondary | ICD-10-CM | POA: Diagnosis not present

## 2016-03-24 DIAGNOSIS — L570 Actinic keratosis: Secondary | ICD-10-CM | POA: Diagnosis not present

## 2016-03-24 DIAGNOSIS — L820 Inflamed seborrheic keratosis: Secondary | ICD-10-CM | POA: Diagnosis not present

## 2016-03-24 DIAGNOSIS — Z85828 Personal history of other malignant neoplasm of skin: Secondary | ICD-10-CM | POA: Diagnosis not present

## 2016-03-24 DIAGNOSIS — L821 Other seborrheic keratosis: Secondary | ICD-10-CM | POA: Diagnosis not present

## 2016-03-25 ENCOUNTER — Encounter: Payer: Medicare Other | Admitting: *Deleted

## 2016-03-27 ENCOUNTER — Encounter: Payer: Self-pay | Admitting: Cardiology

## 2016-06-11 DIAGNOSIS — D485 Neoplasm of uncertain behavior of skin: Secondary | ICD-10-CM | POA: Diagnosis not present

## 2016-06-11 DIAGNOSIS — Z85828 Personal history of other malignant neoplasm of skin: Secondary | ICD-10-CM | POA: Diagnosis not present

## 2016-06-11 DIAGNOSIS — L570 Actinic keratosis: Secondary | ICD-10-CM | POA: Diagnosis not present

## 2016-06-11 DIAGNOSIS — C44729 Squamous cell carcinoma of skin of left lower limb, including hip: Secondary | ICD-10-CM | POA: Diagnosis not present

## 2016-06-19 IMAGING — CT CT RENAL STONE PROTOCOL
2 of 3 series · 16 of 38 positions shown, 18 images · non-contrast
Comparison: Renal ultrasound dated 01/26/2013 and CT dated
01/09/2013

CLINICAL DATA: 74-year-old male with right flank pain

EXAM:
CT ABDOMEN AND PELVIS WITHOUT CONTRAST
TECHNIQUE: Multidetector CT imaging of the abdomen and pelvis was performed
following the standard protocol without IV contrast.

[Series 4: lung · axial · 0.79mm/px · z∈[+41,+146]mm · 13 of 24 slices shown, 15 images]
[im 2/24  soft-tissue]
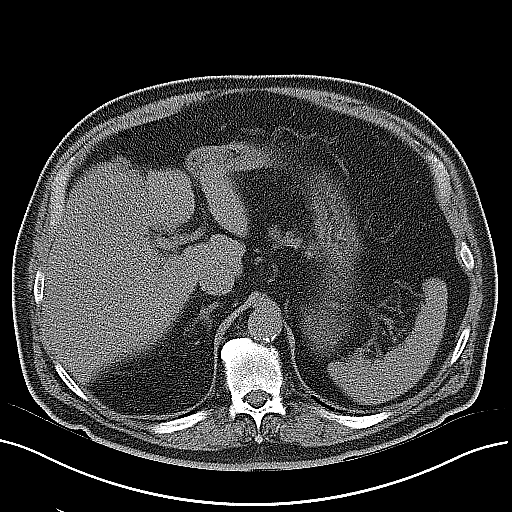
[im 2/24  bone]
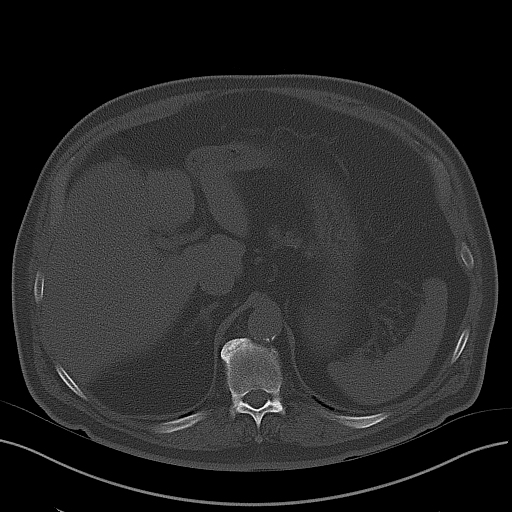
[im 4/24  soft-tissue]
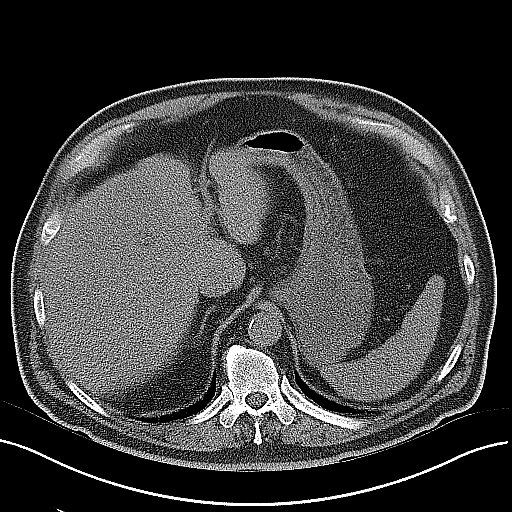
[im 6/24  soft-tissue]
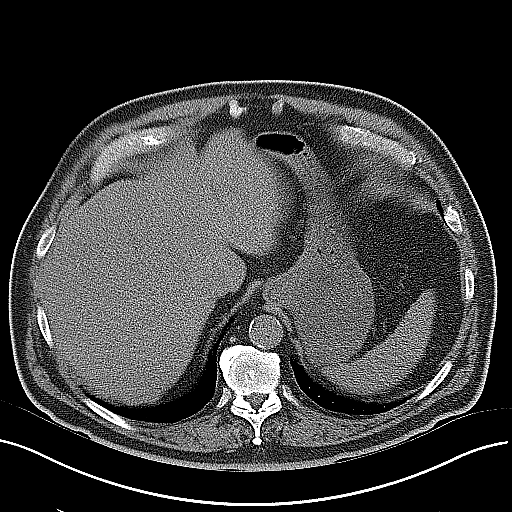
[im 7/24  soft-tissue]
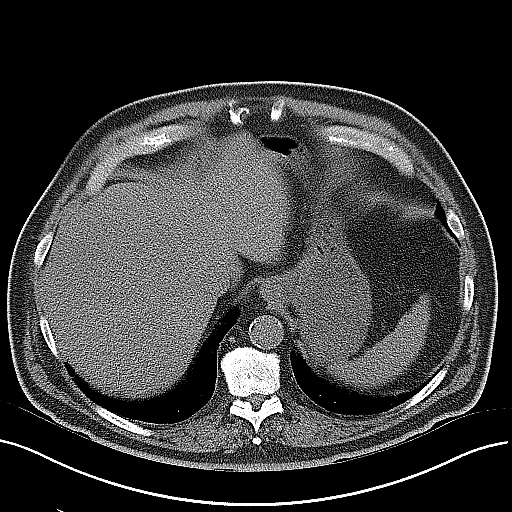
[im 9/24  soft-tissue]
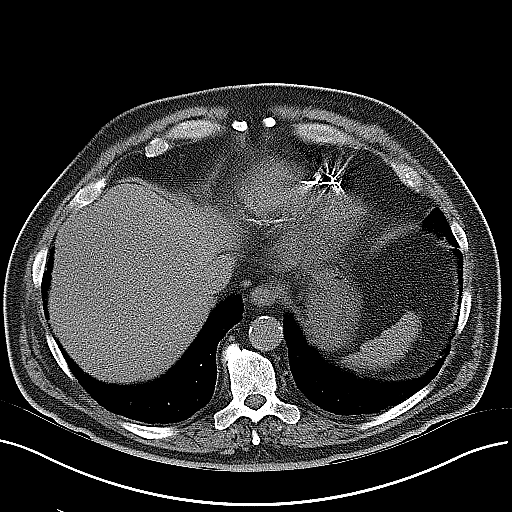
[im 11/24  soft-tissue]
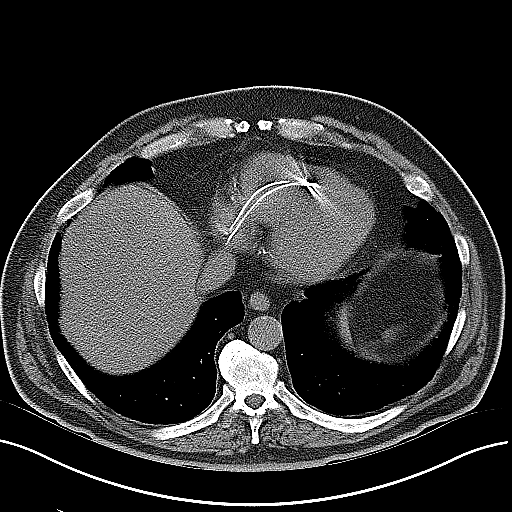
[im 13/24  soft-tissue]
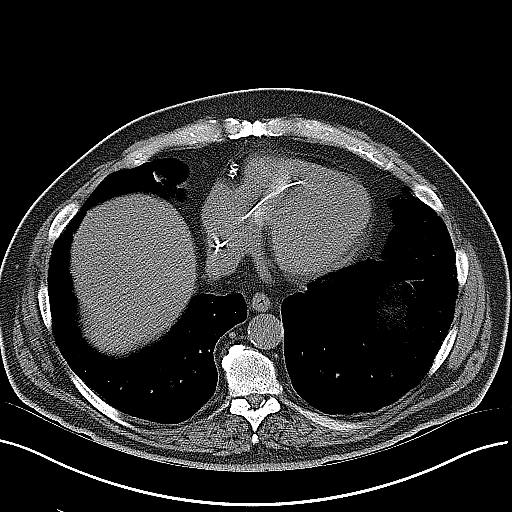
[im 14/24  soft-tissue]
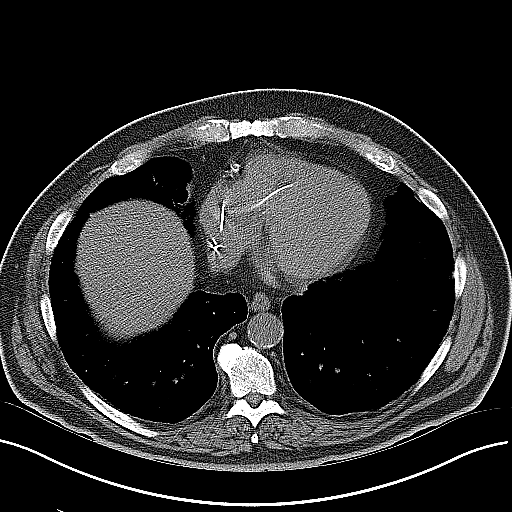
[im 16/24  soft-tissue]
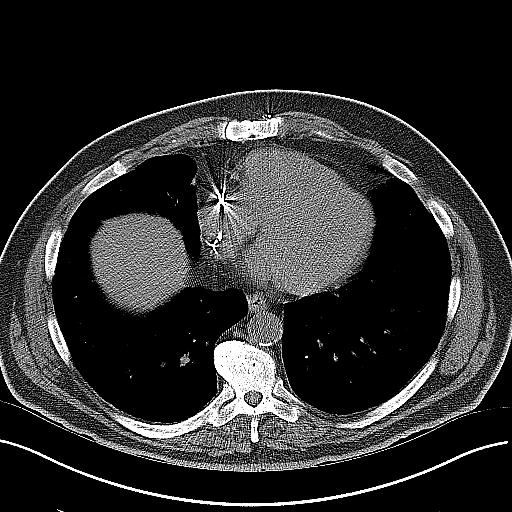
[im 16/24  bone]
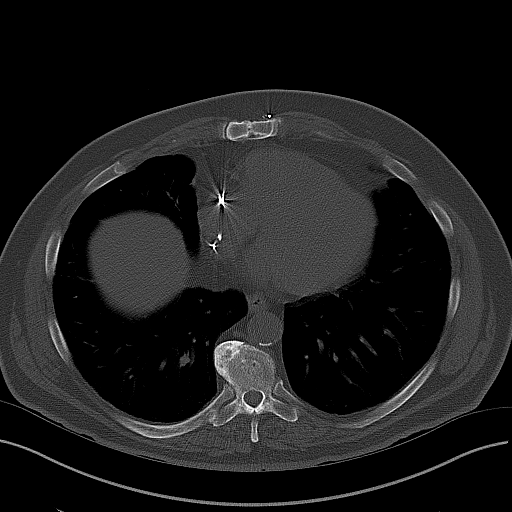
[im 18/24  soft-tissue]
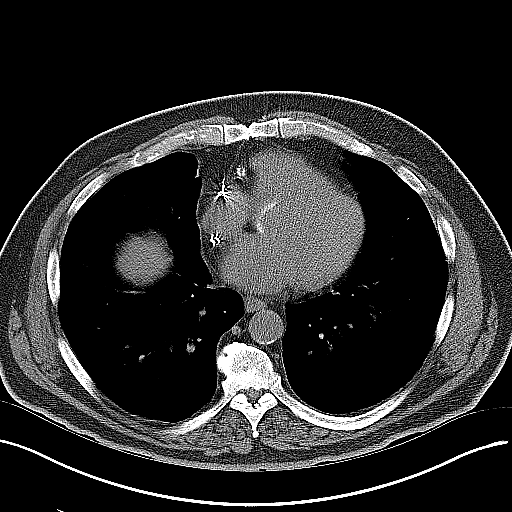
[im 19/24  soft-tissue]
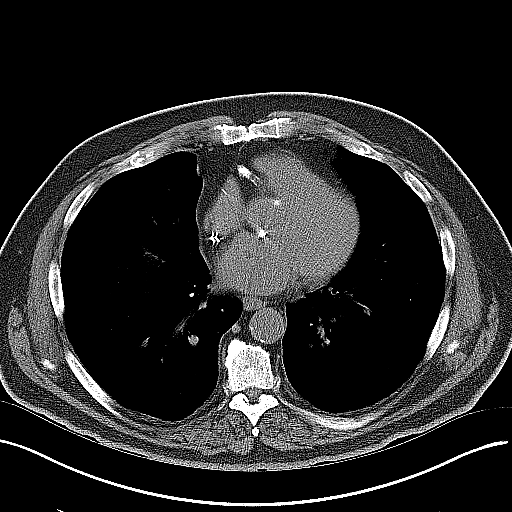
[im 21/24  soft-tissue]
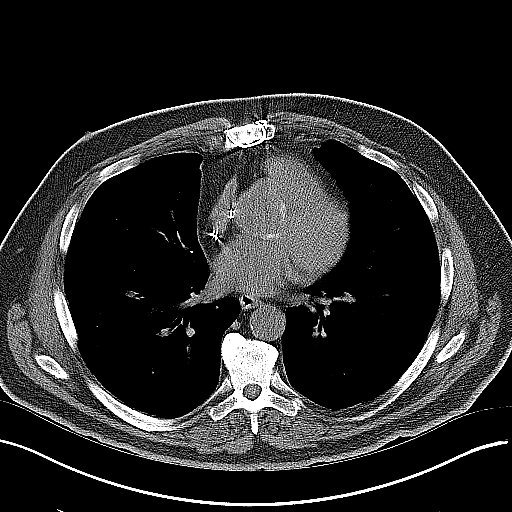
[im 23/24  soft-tissue]
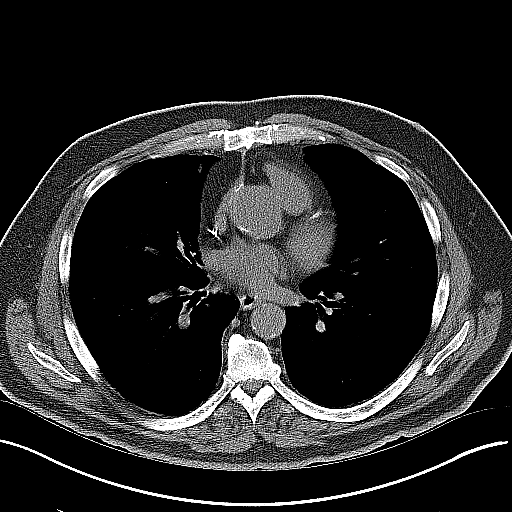

[Series 5: coronal · coronal · 0.78mm/px · 3 of 109 slices shown]
[im 37/109  soft-tissue]
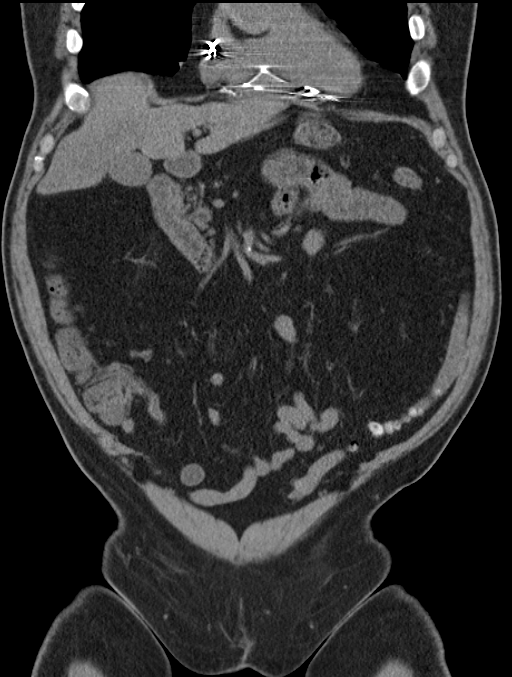
[im 49/109  soft-tissue]
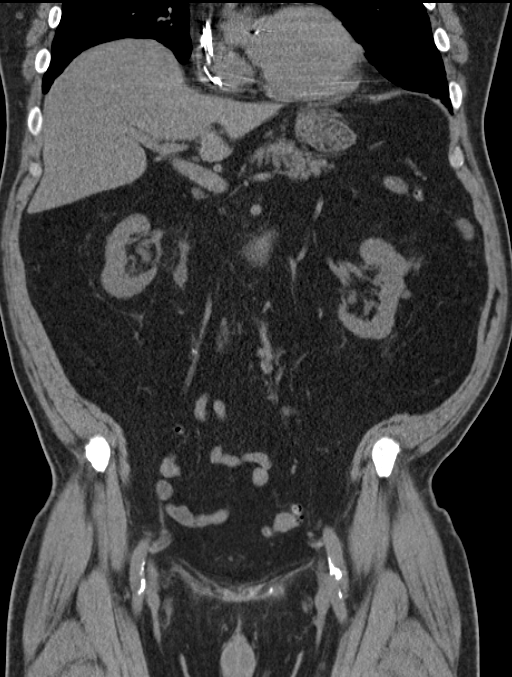
[im 61/109  soft-tissue]
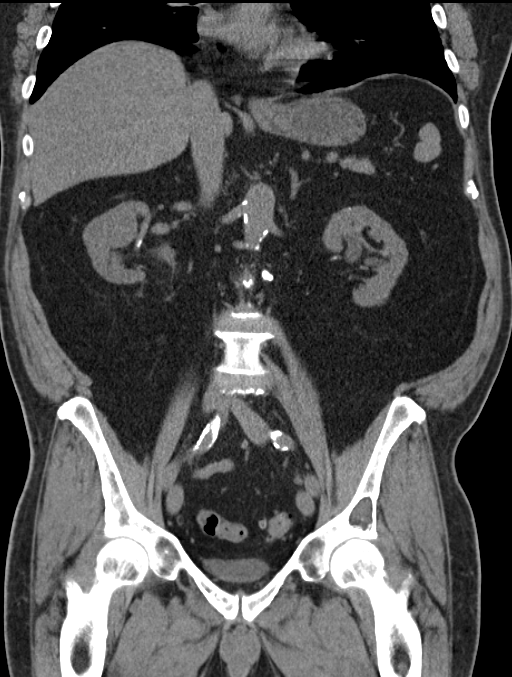

[16 of 38 positions shown; findings below may reference images not displayed]

FINDINGS: Evaluation of this exam is limited in the absence of intravenous
contrast.

The visualized lung bases are clear. There is coronary vascular
calcification. Cardiac pacemaker leads

No intra-abdominal free air or free fluid.

Small stones at the neck of the gallbladder. No pericholecystic
fluid. The liver, pancreas, spleen, adrenal glands appear
unremarkable. There is a 3 mm right UPJ stone with mild right
hydronephrosis. There is no hydronephrosis or nephrolithiasis on the
left. Multiple small left renal parapelvic cysts noted. A 1.8 cm
exophytic hypodense lesion from the anterior cortex of the inferior
pole of the left kidney is not well characterized. Ultrasound may
provide better evaluation. The urinary bladder is grossly
unremarkable. There are coarse calcification of the prostate gland.

There is extensive sigmoid diverticulosis without active
inflammation. No evidence of bowel obstruction. Normal appendix.

There is aortoiliac atherosclerotic disease. There is mild ectasia
of the abdominal aorta. There is no lymphadenopathy. Small fat
containing bilateral inguinal hernias. Degenerative changes of the
spine. No acute fracture.
IMPRESSION: A 3 mm right UPJ stone with mild right hydronephrosis.

Sigmoid diverticulosis.

Cholelithiasis.

## 2016-07-08 DIAGNOSIS — H401113 Primary open-angle glaucoma, right eye, severe stage: Secondary | ICD-10-CM | POA: Diagnosis not present

## 2016-07-08 DIAGNOSIS — H26491 Other secondary cataract, right eye: Secondary | ICD-10-CM | POA: Diagnosis not present

## 2016-07-08 DIAGNOSIS — H524 Presbyopia: Secondary | ICD-10-CM | POA: Diagnosis not present

## 2016-08-27 ENCOUNTER — Other Ambulatory Visit: Payer: Self-pay

## 2016-08-28 DIAGNOSIS — M25572 Pain in left ankle and joints of left foot: Secondary | ICD-10-CM | POA: Diagnosis not present

## 2016-09-17 DIAGNOSIS — Z23 Encounter for immunization: Secondary | ICD-10-CM | POA: Diagnosis not present

## 2016-09-20 ENCOUNTER — Other Ambulatory Visit: Payer: Self-pay | Admitting: Cardiology

## 2016-09-24 DIAGNOSIS — C44729 Squamous cell carcinoma of skin of left lower limb, including hip: Secondary | ICD-10-CM | POA: Diagnosis not present

## 2016-09-24 DIAGNOSIS — L400 Psoriasis vulgaris: Secondary | ICD-10-CM | POA: Diagnosis not present

## 2016-09-24 DIAGNOSIS — D485 Neoplasm of uncertain behavior of skin: Secondary | ICD-10-CM | POA: Diagnosis not present

## 2016-09-24 DIAGNOSIS — L281 Prurigo nodularis: Secondary | ICD-10-CM | POA: Diagnosis not present

## 2016-09-24 DIAGNOSIS — L821 Other seborrheic keratosis: Secondary | ICD-10-CM | POA: Diagnosis not present

## 2016-09-24 DIAGNOSIS — D692 Other nonthrombocytopenic purpura: Secondary | ICD-10-CM | POA: Diagnosis not present

## 2016-09-24 DIAGNOSIS — D1801 Hemangioma of skin and subcutaneous tissue: Secondary | ICD-10-CM | POA: Diagnosis not present

## 2016-09-24 DIAGNOSIS — L570 Actinic keratosis: Secondary | ICD-10-CM | POA: Diagnosis not present

## 2016-09-24 DIAGNOSIS — L820 Inflamed seborrheic keratosis: Secondary | ICD-10-CM | POA: Diagnosis not present

## 2016-09-24 DIAGNOSIS — Z85828 Personal history of other malignant neoplasm of skin: Secondary | ICD-10-CM | POA: Diagnosis not present

## 2016-10-08 DIAGNOSIS — C44729 Squamous cell carcinoma of skin of left lower limb, including hip: Secondary | ICD-10-CM | POA: Diagnosis not present

## 2016-10-08 DIAGNOSIS — L988 Other specified disorders of the skin and subcutaneous tissue: Secondary | ICD-10-CM | POA: Diagnosis not present

## 2016-10-08 DIAGNOSIS — Z85828 Personal history of other malignant neoplasm of skin: Secondary | ICD-10-CM | POA: Diagnosis not present

## 2016-11-03 ENCOUNTER — Encounter: Payer: Self-pay | Admitting: Internal Medicine

## 2016-11-10 DIAGNOSIS — C44729 Squamous cell carcinoma of skin of left lower limb, including hip: Secondary | ICD-10-CM | POA: Diagnosis not present

## 2016-11-10 DIAGNOSIS — D485 Neoplasm of uncertain behavior of skin: Secondary | ICD-10-CM | POA: Diagnosis not present

## 2016-11-10 DIAGNOSIS — Z85828 Personal history of other malignant neoplasm of skin: Secondary | ICD-10-CM | POA: Diagnosis not present

## 2016-11-24 DIAGNOSIS — Z85828 Personal history of other malignant neoplasm of skin: Secondary | ICD-10-CM | POA: Diagnosis not present

## 2016-11-24 DIAGNOSIS — C44729 Squamous cell carcinoma of skin of left lower limb, including hip: Secondary | ICD-10-CM | POA: Diagnosis not present

## 2016-12-07 DIAGNOSIS — L0889 Other specified local infections of the skin and subcutaneous tissue: Secondary | ICD-10-CM | POA: Diagnosis not present

## 2016-12-07 DIAGNOSIS — Z4802 Encounter for removal of sutures: Secondary | ICD-10-CM | POA: Diagnosis not present

## 2016-12-22 DIAGNOSIS — Z4802 Encounter for removal of sutures: Secondary | ICD-10-CM | POA: Diagnosis not present

## 2016-12-24 ENCOUNTER — Other Ambulatory Visit: Payer: Self-pay | Admitting: Cardiology

## 2016-12-25 ENCOUNTER — Encounter (HOSPITAL_COMMUNITY)
Admission: RE | Disposition: A | Discharge status: Home / Self Care | Payer: Self-pay | Source: Ambulatory Visit | Attending: Internal Medicine

## 2016-12-25 ENCOUNTER — Ambulatory Visit (HOSPITAL_COMMUNITY)
Admission: RE | Admit: 2016-12-25 | Discharge: 2016-12-25 | Disposition: A | Discharge status: Home / Self Care | Payer: Medicare Other | Source: Ambulatory Visit | Attending: Internal Medicine | Admitting: Internal Medicine

## 2016-12-25 ENCOUNTER — Ambulatory Visit (INDEPENDENT_AMBULATORY_CARE_PROVIDER_SITE_OTHER): Payer: Medicare Other | Admitting: Internal Medicine

## 2016-12-25 VITALS — BP 132/68 | HR 65 | Ht 71.0 in | Wt 211.0 lb

## 2016-12-25 DIAGNOSIS — E785 Hyperlipidemia, unspecified: Secondary | ICD-10-CM | POA: Diagnosis not present

## 2016-12-25 DIAGNOSIS — I442 Atrioventricular block, complete: Secondary | ICD-10-CM | POA: Diagnosis present

## 2016-12-25 DIAGNOSIS — E669 Obesity, unspecified: Secondary | ICD-10-CM | POA: Diagnosis not present

## 2016-12-25 DIAGNOSIS — I10 Essential (primary) hypertension: Secondary | ICD-10-CM | POA: Insufficient documentation

## 2016-12-25 DIAGNOSIS — Z950 Presence of cardiac pacemaker: Secondary | ICD-10-CM | POA: Diagnosis present

## 2016-12-25 DIAGNOSIS — Z6830 Body mass index (BMI) 30.0-30.9, adult: Secondary | ICD-10-CM | POA: Insufficient documentation

## 2016-12-25 DIAGNOSIS — I4892 Unspecified atrial flutter: Secondary | ICD-10-CM | POA: Insufficient documentation

## 2016-12-25 DIAGNOSIS — Z7982 Long term (current) use of aspirin: Secondary | ICD-10-CM | POA: Insufficient documentation

## 2016-12-25 DIAGNOSIS — Z87442 Personal history of urinary calculi: Secondary | ICD-10-CM | POA: Diagnosis not present

## 2016-12-25 DIAGNOSIS — Z952 Presence of prosthetic heart valve: Secondary | ICD-10-CM | POA: Diagnosis not present

## 2016-12-25 DIAGNOSIS — Z45010 Encounter for checking and testing of cardiac pacemaker pulse generator [battery]: Secondary | ICD-10-CM | POA: Diagnosis not present

## 2016-12-25 HISTORY — PX: EP IMPLANTABLE DEVICE: SHX172B

## 2016-12-25 LAB — CBC WITH DIFFERENTIAL/PLATELET
BASOS ABS: 0 10*3/uL (ref 0.0–0.1)
Basophils Relative: 0 %
Eosinophils Absolute: 0.3 10*3/uL (ref 0.0–0.7)
Eosinophils Relative: 3 %
HEMATOCRIT: 39.9 % (ref 39.0–52.0)
Hemoglobin: 14 g/dL (ref 13.0–17.0)
LYMPHS PCT: 17 %
Lymphs Abs: 1.8 10*3/uL (ref 0.7–4.0)
MCH: 33.1 pg (ref 26.0–34.0)
MCHC: 35.1 g/dL (ref 30.0–36.0)
MCV: 94.3 fL (ref 78.0–100.0)
MONO ABS: 0.7 10*3/uL (ref 0.1–1.0)
Monocytes Relative: 6 %
NEUTROS ABS: 7.7 10*3/uL (ref 1.7–7.7)
Neutrophils Relative %: 74 %
Platelets: 153 10*3/uL (ref 150–400)
RBC: 4.23 MIL/uL (ref 4.22–5.81)
RDW: 12.9 % (ref 11.5–15.5)
WBC: 10.5 10*3/uL (ref 4.0–10.5)

## 2016-12-25 LAB — CUP PACEART INCLINIC DEVICE CHECK
Implantable Lead Implant Date: 20080423
Implantable Lead Location: 753859
Implantable Lead Model: 5076
MDC IDC LEAD IMPLANT DT: 20080423
MDC IDC LEAD LOCATION: 753860
MDC IDC PG IMPLANT DT: 20080423
MDC IDC SESS DTM: 20180105172837

## 2016-12-25 LAB — BASIC METABOLIC PANEL
Anion gap: 5 (ref 5–15)
BUN: 13 mg/dL (ref 6–20)
CALCIUM: 9.4 mg/dL (ref 8.9–10.3)
CO2: 28 mmol/L (ref 22–32)
CREATININE: 1.02 mg/dL (ref 0.61–1.24)
Chloride: 105 mmol/L (ref 101–111)
GFR calc Af Amer: >60 mL/min (ref >60)
GLUCOSE: 86 mg/dL (ref 65–99)
Potassium: 4.4 mmol/L (ref 3.5–5.1)
Sodium: 138 mmol/L (ref 135–145)

## 2016-12-25 LAB — SURGICAL PCR SCREEN
MRSA, PCR: NEGATIVE
Staphylococcus aureus: POSITIVE — AB

## 2016-12-25 SURGERY — PPM GENERATOR CHANGEOUT
Anesthesia: LOCAL

## 2016-12-25 MED ORDER — SODIUM CHLORIDE 0.9 % IV SOLN
INTRAVENOUS | Status: DC
  Administered 2016-12-25: 18:00:00 via INTRAVENOUS

## 2016-12-25 MED ORDER — SODIUM CHLORIDE 0.9 % IV SOLN: INTRAVENOUS | Status: AC

## 2016-12-25 MED ORDER — SODIUM CHLORIDE 0.9 % IR SOLN
80.0000 mg | Status: AC
  Administered 2016-12-25: 80 mg

## 2016-12-25 MED ORDER — MUPIROCIN 2 % EX OINT
1.0000 "application " | TOPICAL_OINTMENT | Freq: Once | CUTANEOUS | Status: AC
  Administered 2016-12-25: 1 via TOPICAL
  Filled 2016-12-25: qty 22

## 2016-12-25 MED ORDER — MUPIROCIN 2 % EX OINT
TOPICAL_OINTMENT | CUTANEOUS | Status: AC
  Administered 2016-12-25: 1 via TOPICAL
  Filled 2016-12-25: qty 22

## 2016-12-25 MED ORDER — CEFAZOLIN SODIUM-DEXTROSE 2-4 GM/100ML-% IV SOLN
2.0000 g | INTRAVENOUS | Status: AC
  Administered 2016-12-25: 2 g via INTRAVENOUS

## 2016-12-25 MED ORDER — ONDANSETRON HCL 4 MG/2ML IJ SOLN: 4.0000 mg | Freq: Four times a day (QID) | INTRAMUSCULAR | Status: DC | PRN

## 2016-12-25 MED ORDER — LIDOCAINE HCL (PF) 1 % IJ SOLN
INTRAMUSCULAR | Status: AC
  Filled 2016-12-25: qty 30

## 2016-12-25 MED ORDER — SODIUM CHLORIDE 0.9 % IR SOLN
Status: AC
  Filled 2016-12-25: qty 2

## 2016-12-25 MED ORDER — CHLORHEXIDINE GLUCONATE 4 % EX LIQD
60.0000 mL | Freq: Once | CUTANEOUS | Status: DC
  Filled 2016-12-25: qty 60

## 2016-12-25 MED ORDER — CEFAZOLIN SODIUM-DEXTROSE 2-4 GM/100ML-% IV SOLN
INTRAVENOUS | Status: AC
  Filled 2016-12-25: qty 100

## 2016-12-25 MED ORDER — ACETAMINOPHEN 325 MG PO TABS: 325.0000 mg | ORAL_TABLET | ORAL | Status: DC | PRN

## 2016-12-25 MED ORDER — LIDOCAINE HCL (PF) 1 % IJ SOLN
INTRAMUSCULAR | Status: DC | PRN
  Administered 2016-12-25: 30 mL via INTRADERMAL

## 2016-12-25 SURGICAL SUPPLY — 6 items
CABLE SURGICAL S-101-97-12 (CABLE) ×2 IMPLANT
HEMOSTAT SURGICEL 2X4 FIBR (HEMOSTASIS) ×1 IMPLANT
PACEMAKER ADAPTA DR ADDRL1 (Pacemaker) IMPLANT
PAD DEFIB LIFELINK (PAD) ×1 IMPLANT
PPM ADAPTA DR ADDRL1 (Pacemaker) ×2 IMPLANT
TRAY PACEMAKER INSERTION (PACKS) ×1 IMPLANT

## 2016-12-25 NOTE — Patient Instructions (Signed)
Medication Instructions:  Your physician recommends that you continue on your current medications as directed. Please refer to the Current Medication list given to you today.   Labwork: Labs short stay today  Testing/Procedures: Pacemaker generator change today--go to [place] and will be directed to Short Stay  Follow-Up: Your physician recommends that you schedule a follow-up appointment in: 10-14 days in device clinic for wound check and 3 months with Dr Rayann Heman   Any Other Special Instructions Will Be Listed Below (If Applicable).     If you need a refill on your cardiac medications before your next appointment, please call your pharmacy.

## 2016-12-25 NOTE — H&P (View-Only) (Signed)
PCP: Gennette Pac, MD Primary Cardiologist:  Dr Aundra Dubin Electrophysiologist: Doc Mandala  Scott [name] is a 76 y.o. male who presents today for routine electrophysiology followup.  Since last being seen in our clinic, the patient reports doing very well.  He has not noticed exercise intolerance, shortness of breath, or fatigue.  He has not been able to remotely transmit 2/2 his Carelink monitor being in storage. Today, he denies symptoms of palpitations, chest pain, shortness of breath,  lower extremity edema, dizziness, presyncope, or syncope.  The patient is otherwise without complaint today.   Past Medical History:  Diagnosis Date  . Atherosclerosis   . Complete heart block (HCC)    s/p PPM following AVR  . Hyperlipidemia   . Kidney calculus   . Kidney stones   . Obesity   . Pacemaker    Past Surgical History:  Procedure Laterality Date  . AORTIC ROOT REPLACEMENT    . AORTIC VALVE REPLACEMENT    . CARDIAC CATHETERIZATION  03/28/2007   OVERALL CARDIAC SIZE AND SILHOUETTE WERE NORMAL. GLOBAL FUNCTION AND REGIONAL WALL WERE NORMAL  . INSERT / REPLACE / REMOVE PACEMAKER  03/2007    Current Outpatient Prescriptions  Medication Sig Dispense Refill  . aspirin EC 81 MG tablet Take 1 tablet (81 mg total) by mouth daily.    Marland Kitchen atorvastatin (LIPITOR) 20 MG tablet Take 1 tablet (20 mg total) by mouth daily. 90 tablet 0  . calcipotriene (DOVONOX) 0.005 % ointment Apply 1 application topically 2 (two) times daily.     . Cholecalciferol (VITAMIN D PO) Take 2,000 Units by mouth daily.     . DiphenhydrAMINE HCl, Sleep, (SIMPLY SLEEP PO) Take 1-2 tablets by mouth at bedtime as needed. For sleep    . fish oil-omega-3 fatty acids 1000 MG capsule Take 1 g by mouth daily.     . metoprolol tartrate (LOPRESSOR) 25 MG tablet TAKE ONE TABLET BY MOUTH TWICE DAILY 180 tablet 3  . ranitidine (ZANTAC) 150 MG tablet Take 150 mg by mouth at bedtime as needed. For acid reflux    . timolol (TIMOPTIC) 0.5 %  ophthalmic solution Place 1 drop into the right eye 2 (two) times daily.      No current facility-administered medications for this visit.     Physical Exam: Vitals:   12/25/16 1547  BP: 132/68  Pulse: 65  Weight: 211 lb (95.7 kg)  Height: 5\' 11"  (1.803 m)    GEN- The patient is well appearing, alert and oriented x 3 today.   Head- normocephalic, atraumatic Eyes-  Sclera clear, conjunctiva pink Ears- hearing intact Oropharynx- clear Lungs- Clear to ausculation bilaterally, normal work of breathing Chest- pacemaker pocket is well healed Heart- Regular rate and rhythm GI- soft, NT, ND, + BS Extremities- no clubbing, cyanosis, or edema  Pacemaker interrogation- reviewed in detail today,  See PACEART report  Assessment and Plan: 1.  Complete heart block  Pacemaker at EOS today  He is dependent with no ventricular escape. Battery voltage is 2.42V/14,910ohms. There is concern for imminent battery failure. With device dependence, will arrange for emergent pacemaker generator change this afternoon. Risks, benefits reviewed with patient who wishes to proceed.   2.  Hypertension Stable  No change required today   Follow up after generator change   Today, I have spent 40 minutes with the patient discussing need further urgent pacemaker generator change.  More than 50% of the visit time today was spent on this issue.   Thompson Grayer  MD, [facility] 12/25/2016 4:01 PM

## 2016-12-25 NOTE — Discharge Instructions (Signed)
REMOVE PRESSURE DRESSING Monday MAKE FOLLOW UP APPOINTMENT KEEP IT DRY TILL 10 DAY FOLLOW UP  Pacemaker Battery Change, Care After Refer to this sheet in the next few weeks. These instructions provide you with information on caring for yourself after your procedure. Your health care provider may also give you more specific instructions. Your treatment has been planned according to current medical practices, but problems sometimes occur. Call your health care provider if you have any problems or questions after your procedure. WHAT TO EXPECT AFTER THE PROCEDURE After your procedure, it is typical to have the following sensations:  Soreness at the pacemaker site. HOME CARE INSTRUCTIONS   Keep the incision clean and dry.  Unless advised otherwise, you may shower beginning 48 hours after your procedure.  For the first week after the replacement, avoid stretching motions that pull at the incision site, and avoid heavy exercise with the arm that is on the same side as the incision.  Take medicines only as directed by your health care provider.  Keep all follow-up visits as directed by your health care provider. SEEK MEDICAL CARE IF:   You have pain at the incision site that is not relieved by over-the-counter or prescription medicine.  There is drainage or pus from the incision site.  There is swelling larger than a lime at the incision site.  You develop red streaking that extends above or below the incision site.  You feel brief, intermittent palpitations, light-headedness, or any symptoms that you feel might be related to your heart. SEEK IMMEDIATE MEDICAL CARE IF:   You experience chest pain that is different than the pain at the pacemaker site.  You experience shortness of breath.  You have palpitations or irregular heartbeat.  You have light-headedness that does not go away quickly.  You faint.  You have pain that gets worse and is not relieved by medicine. This information  is not intended to replace advice given to you by your health care provider. Make sure you discuss any questions you have with your health care provider. Document Released: 09/27/2013 Document Revised: 12/28/2014 Document Reviewed: 09/27/2013 Elsevier Interactive Patient Education  2017 Elsevier Inc.  Mupirocin nasal ointment What is this medicine? MUPIROCIN CALCIUM (myoo PEER oh sin KAL see um) is an antibiotic. It is used inside the nose to treat infections that are caused by certain bacteria. This helps prevent the spread of infection to patients and health care workers during outbreaks at institutions. This medicine may be used for other purposes; ask your health care provider or pharmacist if you have questions. COMMON BRAND NAME(S): Bactroban What should I tell my health care provider before I take this medicine? They need to know if you have any of these conditions: -an unusual or allergic reaction to mupirocin, other medicines, foods, dyes, or preservatives -pregnant or trying to get pregnant -breast-feeding How should I use this medicine? This medicine is only for use inside the nose. Follow the directions on the prescription label. Wash your hands before and after use. Squeeze half the contents of a single-use tube into one nostril, then squeeze the other half into the other nostril. Press the sides of your nose together and gently massage after application to spread the ointment throughout the nostrils. Do not use your medicine more often than directed. Finish the full course of medicine prescribed by your doctor or health care professional even if you think your condition is better. Talk to your pediatrician regarding the use of this medicine in children.  Special care may be needed. Overdosage: If you think you have taken too much of this medicine contact a poison control center or emergency room at once. NOTE: This medicine is only for you. Do not share this medicine with  others. What if I miss a dose? If you miss a dose, take it as soon as you can. If it is almost time for your next dose, take only that dose. Do not take double or extra doses. What may interact with this medicine? Interactions are not expected. Do not use any other nose products without telling your doctor or health care professional. This list may not describe all possible interactions. Give your health care provider a list of all the medicines, herbs, non-prescription drugs, or dietary supplements you use. Also tell them if you smoke, drink alcohol, or use illegal drugs. Some items may interact with your medicine. What should I watch for while using this medicine? If your nose is severely irritated, burning or stinging from use of this medicine, stop using it and contact your doctor or health care professional. Do not get this medicine in your eyes. If you do, rinse out with plenty of cool tap water. What side effects may I notice from receiving this medicine? Side effects that you should report to your doctor or health care professional as soon as possible: -severe irritation, burning, stinging, or pain Side effects that usually do not require medical attention (report to your doctor or health care professional if they continue or are bothersome): -altered taste -cough -headache -skin itching -sore throat -stuffy or runny nose This list may not describe all possible side effects. Call your doctor for medical advice about side effects. You may report side effects to FDA at 1-800-FDA-1088. Where should I keep my medicine? Keep out of the reach of children. Store at room temperature between 15 and 30 degrees C (59 and 86 degrees F). Do not refrigerate. One tube of ointment is for single use in both nostrils. Throw away after use. NOTE: This sheet is a summary. It may not cover all possible information. If you have questions about this medicine, talk to your doctor, pharmacist, or health care  provider.  2017 Elsevier/Gold Standard (2008-06-25 14:36:10)

## 2016-12-25 NOTE — Interval H&P Note (Signed)
History and Physical Interval Note:  12/25/2016 5:51 PM  Scott [name]  has presented today for surgery, with the diagnosis of eol  The various methods of treatment have been discussed with the patient and family. After consideration of risks, benefits and other options for treatment, the patient has consented to  Procedure(s): PPM Generator Changeout (N/A) as a surgical intervention .  The patient's history has been reviewed, patient examined, no change in status, stable for surgery.  I have reviewed the patient's chart and labs.  Questions were answered to the patient's satisfaction.     [name]  We have reviewed the benefits and risks of generator replacement.  These include but are not limited to lead fracture and infection.  The patient understands, agrees and is willing to proceed.

## 2016-12-25 NOTE — Progress Notes (Signed)
PCP: Gennette Pac, MD Primary Cardiologist:  Dr Aundra Dubin Electrophysiologist: Toneisha Savary  Scott [name] is a 76 y.o. male who presents today for routine electrophysiology followup.  Since last being seen in our clinic, the patient reports doing very well.  He has not noticed exercise intolerance, shortness of breath, or fatigue.  He has not been able to remotely transmit 2/2 his Carelink monitor being in storage. Today, he denies symptoms of palpitations, chest pain, shortness of breath,  lower extremity edema, dizziness, presyncope, or syncope.  The patient is otherwise without complaint today.   Past Medical History:  Diagnosis Date  . Atherosclerosis   . Complete heart block (HCC)    s/p PPM following AVR  . Hyperlipidemia   . Kidney calculus   . Kidney stones   . Obesity   . Pacemaker    Past Surgical History:  Procedure Laterality Date  . AORTIC ROOT REPLACEMENT    . AORTIC VALVE REPLACEMENT    . CARDIAC CATHETERIZATION  03/28/2007   OVERALL CARDIAC SIZE AND SILHOUETTE WERE NORMAL. GLOBAL FUNCTION AND REGIONAL WALL WERE NORMAL  . INSERT / REPLACE / REMOVE PACEMAKER  03/2007    Current Outpatient Prescriptions  Medication Sig Dispense Refill  . aspirin EC 81 MG tablet Take 1 tablet (81 mg total) by mouth daily.    Marland Kitchen atorvastatin (LIPITOR) 20 MG tablet Take 1 tablet (20 mg total) by mouth daily. 90 tablet 0  . calcipotriene (DOVONOX) 0.005 % ointment Apply 1 application topically 2 (two) times daily.     . Cholecalciferol (VITAMIN D PO) Take 2,000 Units by mouth daily.     . DiphenhydrAMINE HCl, Sleep, (SIMPLY SLEEP PO) Take 1-2 tablets by mouth at bedtime as needed. For sleep    . fish oil-omega-3 fatty acids 1000 MG capsule Take 1 g by mouth daily.     . metoprolol tartrate (LOPRESSOR) 25 MG tablet TAKE ONE TABLET BY MOUTH TWICE DAILY 180 tablet 3  . ranitidine (ZANTAC) 150 MG tablet Take 150 mg by mouth at bedtime as needed. For acid reflux    . timolol (TIMOPTIC) 0.5 %  ophthalmic solution Place 1 drop into the right eye 2 (two) times daily.      No current facility-administered medications for this visit.     Physical Exam: Vitals:   12/25/16 1547  BP: 132/68  Pulse: 65  Weight: 211 lb (95.7 kg)  Height: 5\' 11"  (1.803 m)    GEN- The patient is well appearing, alert and oriented x 3 today.   Head- normocephalic, atraumatic Eyes-  Sclera clear, conjunctiva pink Ears- hearing intact Oropharynx- clear Lungs- Clear to ausculation bilaterally, normal work of breathing Chest- pacemaker pocket is well healed Heart- Regular rate and rhythm GI- soft, NT, ND, + BS Extremities- no clubbing, cyanosis, or edema  Pacemaker interrogation- reviewed in detail today,  See PACEART report  Assessment and Plan: 1.  Complete heart block  Pacemaker at EOS today  He is dependent with no ventricular escape. Battery voltage is 2.42V/14,910ohms. There is concern for imminent battery failure. With device dependence, will arrange for emergent pacemaker generator change this afternoon. Risks, benefits reviewed with patient who wishes to proceed.   2.  Hypertension Stable  No change required today   Follow up after generator change   Today, I have spent 40 minutes with the patient discussing need further urgent pacemaker generator change.  More than 50% of the visit time today was spent on this issue.   Thompson Grayer  MD, [facility] 12/25/2016 4:01 PM

## 2016-12-26 ENCOUNTER — Other Ambulatory Visit: Payer: Self-pay | Admitting: Cardiology

## 2016-12-28 ENCOUNTER — Encounter (HOSPITAL_COMMUNITY): Payer: Self-pay | Admitting: Internal Medicine

## 2016-12-28 ENCOUNTER — Encounter: Payer: Medicare Other | Admitting: Internal Medicine

## 2016-12-29 ENCOUNTER — Telehealth: Payer: Self-pay | Admitting: Internal Medicine

## 2016-12-29 NOTE — Telephone Encounter (Signed)
New message   Pt verbalized that he has been instructed that Dr.Allred was to call him 12/28/16 but he did not receive a call  Please advice

## 2016-12-29 NOTE — Telephone Encounter (Signed)
Spoke with patient and let him know I would discuss with Dr Rayann Heman tomorrow and will need to call him back after 5 pm

## 2016-12-31 NOTE — Telephone Encounter (Addendum)
Spoke with Dr Rayann Heman and he said we could see him to discuss the flutter that Dr Caryl Comes had seen at generator change at his 01/06/17 wound check appointment. (Dr Allred here in clinic 1/17) When I called the patient to discuss this he wanted to know if Dr Rayann Heman could call in an antibiotic for him. I asked why he felt he needed this. He says it is extremely swollen and feels hot to the touch.  Says it has become more swollen in the last few days. I will have him come in to the device clinic to address.  He can not come today so will add him on for tomorrow.  Device clinic aware.

## 2016-12-31 NOTE — Telephone Encounter (Signed)
Follow Up:   Pt says he is still waiting to hear something,he thought he was supposed to have gotten a call yesterday.

## 2017-01-01 ENCOUNTER — Ambulatory Visit: Payer: Medicare Other | Admitting: *Deleted

## 2017-01-01 DIAGNOSIS — Z950 Presence of cardiac pacemaker: Secondary | ICD-10-CM

## 2017-01-01 NOTE — Progress Notes (Signed)
Patient presented today with complaints of redness, swelling and an incision that was hot to the touch. Upon assessment incision was dressed with intact derma bond and free of any redness. Incision and pocket were consistent in temperature to the chest and abdomen. Pocket had slight swelling that was soft to palpation. I encouraged patient to continue to monitor site and that we would keep his wound check appt for 1/17. I gave him the device clinic number for any further concerns. Patient verbalized understanding and was comfortable with this plan.

## 2017-01-06 ENCOUNTER — Ambulatory Visit: Payer: Medicare Other

## 2017-01-20 DIAGNOSIS — H401113 Primary open-angle glaucoma, right eye, severe stage: Secondary | ICD-10-CM | POA: Diagnosis not present

## 2017-01-20 DIAGNOSIS — H524 Presbyopia: Secondary | ICD-10-CM | POA: Diagnosis not present

## 2017-01-21 ENCOUNTER — Ambulatory Visit (INDEPENDENT_AMBULATORY_CARE_PROVIDER_SITE_OTHER): Payer: Medicare Other | Admitting: Internal Medicine

## 2017-01-21 DIAGNOSIS — I442 Atrioventricular block, complete: Secondary | ICD-10-CM | POA: Diagnosis not present

## 2017-01-21 LAB — CUP PACEART INCLINIC DEVICE CHECK
Battery Impedance: 100 Ohm
Battery Remaining Longevity: 157 mo
Battery Voltage: 2.79 V
Brady Statistic AP VP Percent: 2 %
Brady Statistic AS VP Percent: 96 %
Implantable Lead Implant Date: 20080423
Implantable Lead Implant Date: 20080423
Implantable Lead Location: 753860
Implantable Pulse Generator Implant Date: 20180105
Lead Channel Impedance Value: 562 Ohm
Lead Channel Setting Pacing Amplitude: 2 V
Lead Channel Setting Pacing Amplitude: 2.5 V
Lead Channel Setting Pacing Pulse Width: 0.4 ms
Lead Channel Setting Sensing Sensitivity: 2.8 mV
MDC IDC LEAD LOCATION: 753859
MDC IDC MSMT LEADCHNL RA SENSING INTR AMPL: 1 mV
MDC IDC MSMT LEADCHNL RV IMPEDANCE VALUE: 933 Ohm
MDC IDC MSMT LEADCHNL RV PACING THRESHOLD AMPLITUDE: 0.75 V
MDC IDC MSMT LEADCHNL RV PACING THRESHOLD PULSEWIDTH: 0.4 ms
MDC IDC SESS DTM: 20180201172656
MDC IDC STAT BRADY AP VS PERCENT: 0 %
MDC IDC STAT BRADY AS VS PERCENT: 2 %

## 2017-01-21 MED ORDER — APIXABAN 5 MG PO TABS: 5.0000 mg | ORAL_TABLET | Freq: Two times a day (BID) | ORAL | 0 refills | Status: DC

## 2017-01-21 MED ORDER — APIXABAN 5 MG PO TABS: 5.0000 mg | ORAL_TABLET | Freq: Two times a day (BID) | ORAL | 3 refills | Status: DC

## 2017-01-21 NOTE — Progress Notes (Signed)
Patient Care Team: Hulan Fess, MD as PCP - General (Family Medicine)   HPI  Scott [name] is a 76 y.o. male Seen as an add-on today because of atrial flutter. He underwent pacemaker generator replacement a couple of weeks ago emergently. He was noted at that time to be in atrial flutter; discussions with Dr. Greggory Brandy  Prompted deicison to begin anticoagulation   That appt got snowed out  He is not clear that he is not back at his baseline following pacing; ie not sure that there are symptoms relaed to Afib   Records  and Results Reviewed   Past Medical History:  Diagnosis Date  . Atherosclerosis   . Complete heart block (HCC)    s/p PPM following AVR  . Hyperlipidemia   . Kidney calculus   . Kidney stones   . Obesity   . Pacemaker     Past Surgical History:  Procedure Laterality Date  . AORTIC ROOT REPLACEMENT    . AORTIC VALVE REPLACEMENT    . CARDIAC CATHETERIZATION  03/28/2007   OVERALL CARDIAC SIZE AND SILHOUETTE WERE NORMAL. GLOBAL FUNCTION AND REGIONAL WALL WERE NORMAL  . EP IMPLANTABLE DEVICE N/A 12/25/2016   Procedure: PPM Generator Changeout;  Surgeon: Deboraha Sprang, MD;  Location: [place] [facility];  Service: Cardiovascular;  Laterality: N/A;  . INSERT / REPLACE / REMOVE PACEMAKER  03/2007    Current Outpatient Prescriptions  Medication Sig Dispense Refill  . apixaban (ELIQUIS) 5 MG TABS tablet Take 1 tablet (5 mg total) by mouth 2 (two) times daily. 60 tablet 0  . apixaban (ELIQUIS) 5 MG TABS tablet Take 1 tablet (5 mg total) by mouth 2 (two) times daily. 60 tablet 3  . aspirin EC 81 MG tablet Take 1 tablet (81 mg total) by mouth daily.    Marland Kitchen atorvastatin (LIPITOR) 20 MG tablet TAKE ONE TABLET BY MOUTH ONCE DAILY 90 tablet 0  . calcipotriene (DOVONOX) 0.005 % ointment Apply 1 application topically 2 (two) times daily.     . Cholecalciferol (VITAMIN D PO) Take 2,000 Units by mouth daily.     . DiphenhydrAMINE HCl, Sleep, (SIMPLY SLEEP PO) Take 1-2 tablets  by mouth at bedtime as needed. For sleep    . fish oil-omega-3 fatty acids 1000 MG capsule Take 1 g by mouth daily.     . metoprolol tartrate (LOPRESSOR) 25 MG tablet TAKE ONE TABLET BY MOUTH TWICE DAILY 180 tablet 3  . ranitidine (ZANTAC) 150 MG tablet Take 150 mg by mouth at bedtime as needed. For acid reflux    . timolol (TIMOPTIC) 0.5 % ophthalmic solution Place 1 drop into the right eye 2 (two) times daily.      No current facility-administered medications for this visit.     Allergies  Allergen Reactions  . Clindamycin/Lincomycin Itching    Unknown reaction  . Morphine Itching    Unknown reaction      Review of Systems negative except from HPI and PMH  Physical Exam HR 75 Well developed and well nourished in no acute distress HENT normal E scleral and icterus clear Neck Supple JVP flat; carotids brisk and full Clear to ausculation Device pocket well healed; without hematoma or erythema.  There is no tethering Regular rate and rhythm, no murmurs gallops or rub Soft with active bowel sounds No clubbing cyanosis   Edema Alert and oriented, grossly normal motor and sensory function Skin Warm and Dry    Assessment and  Plan  Atrial flutter   Aortic valve Bioprosthesis  Pacer medtronic   the patient has persistent atrial flutter. His CHADS-VASc score is greater than or equal to 3 (age-13 hypertension-1 ) we will initiate anticoagulation using apixaban adjunctive to his aspirin  as he has an aortic valve bioprosthesis   We will plan to undertake cardioversion in about 3 weeks with subsequent decisions as to antiarrhythmic therapy      Current medicines are reviewed at length with the patient today .  The patient does not  have concerns regarding medicines.

## 2017-01-28 DIAGNOSIS — Z85828 Personal history of other malignant neoplasm of skin: Secondary | ICD-10-CM | POA: Diagnosis not present

## 2017-01-28 DIAGNOSIS — D485 Neoplasm of uncertain behavior of skin: Secondary | ICD-10-CM | POA: Diagnosis not present

## 2017-01-28 DIAGNOSIS — C44729 Squamous cell carcinoma of skin of left lower limb, including hip: Secondary | ICD-10-CM | POA: Diagnosis not present

## 2017-02-12 ENCOUNTER — Telehealth: Payer: Self-pay | Admitting: Internal Medicine

## 2017-02-12 NOTE — Telephone Encounter (Signed)
Follow Up   Per pt received message to call back and ask for Sherri Price or [employer] He didn't know what it was about.

## 2017-02-12 NOTE — Telephone Encounter (Signed)
Spoke wit patient and he can have the DCCV next week 3/1, 3/2, or 3/5.  I let him know we would get this scheduled and call him back

## 2017-02-15 NOTE — Telephone Encounter (Signed)
Follow up    Pt calling to ask about appt being scheduled for him.

## 2017-02-15 NOTE — Telephone Encounter (Addendum)
DCCV scheduled for Fri 02/19/17 at 11:00am Patient to check in at [facility] at 9:30am NPO after midnight  Okay to take medications with a small sip of water the morning of the procedure Will need someone to drive you home after the procedure Patient aware

## 2017-02-16 ENCOUNTER — Other Ambulatory Visit: Payer: Self-pay | Admitting: Cardiology

## 2017-02-18 NOTE — Anesthesia Preprocedure Evaluation (Addendum)
Anesthesia Evaluation  Patient identified by MRN, date of birth, ID band Patient awake    Reviewed: Allergy & Precautions, H&P , NPO status , Patient's Chart, lab work & pertinent test results  Airway Mallampati: II  TM Distance: >3 FB Neck ROM: Full    Dental no notable dental hx. (+) Teeth Intact, Dental Advisory Given   Pulmonary sleep apnea , former smoker,    Pulmonary exam normal breath sounds clear to auscultation       Cardiovascular Exercise Tolerance: Good hypertension, Pt. on medications and Pt. on home beta blockers + dysrhythmias Atrial Fibrillation + pacemaker  Rhythm:Regular Rate:Normal     Neuro/Psych negative neurological ROS  negative psych ROS   GI/Hepatic Neg liver ROS, GERD  Medicated and Controlled,  Endo/Other  negative endocrine ROS  Renal/GU negative Renal ROS  negative genitourinary   Musculoskeletal   Abdominal   Peds  Hematology negative hematology ROS (+)   Anesthesia Other Findings   Reproductive/Obstetrics negative OB ROS                            Anesthesia Physical Anesthesia Plan  ASA: III  Anesthesia Plan: General   Post-op Pain Management:    Induction: Intravenous  Airway Management Planned: Mask  Additional Equipment:   Intra-op Plan:   Post-operative Plan:   Informed Consent: I have reviewed the patients History and Physical, chart, labs and discussed the procedure including the risks, benefits and alternatives for the proposed anesthesia with the patient or authorized representative who has indicated his/her understanding and acceptance.   Dental advisory given  Plan Discussed with: CRNA  Anesthesia Plan Comments:         Anesthesia Quick Evaluation

## 2017-02-19 ENCOUNTER — Ambulatory Visit (HOSPITAL_COMMUNITY): Payer: Medicare Other | Admitting: Anesthesiology

## 2017-02-19 ENCOUNTER — Ambulatory Visit (HOSPITAL_COMMUNITY)
Admission: RE | Admit: 2017-02-19 | Discharge: 2017-02-19 | Disposition: A | Discharge status: Home / Self Care | Payer: Medicare Other | Source: Ambulatory Visit | Attending: Cardiovascular Disease | Admitting: Cardiovascular Disease

## 2017-02-19 ENCOUNTER — Encounter (HOSPITAL_COMMUNITY): Payer: Self-pay

## 2017-02-19 ENCOUNTER — Telehealth: Payer: Self-pay | Admitting: *Deleted

## 2017-02-19 ENCOUNTER — Encounter (HOSPITAL_COMMUNITY)
Admission: RE | Disposition: A | Discharge status: Home / Self Care | Payer: Self-pay | Source: Ambulatory Visit | Attending: Cardiovascular Disease

## 2017-02-19 DIAGNOSIS — G4730 Sleep apnea, unspecified: Secondary | ICD-10-CM | POA: Diagnosis not present

## 2017-02-19 DIAGNOSIS — K219 Gastro-esophageal reflux disease without esophagitis: Secondary | ICD-10-CM | POA: Diagnosis not present

## 2017-02-19 DIAGNOSIS — E669 Obesity, unspecified: Secondary | ICD-10-CM | POA: Diagnosis not present

## 2017-02-19 DIAGNOSIS — I10 Essential (primary) hypertension: Secondary | ICD-10-CM | POA: Diagnosis not present

## 2017-02-19 DIAGNOSIS — Z950 Presence of cardiac pacemaker: Secondary | ICD-10-CM | POA: Diagnosis not present

## 2017-02-19 DIAGNOSIS — Z7901 Long term (current) use of anticoagulants: Secondary | ICD-10-CM | POA: Insufficient documentation

## 2017-02-19 DIAGNOSIS — I4892 Unspecified atrial flutter: Secondary | ICD-10-CM | POA: Diagnosis not present

## 2017-02-19 DIAGNOSIS — E785 Hyperlipidemia, unspecified: Secondary | ICD-10-CM | POA: Diagnosis not present

## 2017-02-19 DIAGNOSIS — I481 Persistent atrial fibrillation: Secondary | ICD-10-CM

## 2017-02-19 DIAGNOSIS — I4819 Other persistent atrial fibrillation: Secondary | ICD-10-CM

## 2017-02-19 DIAGNOSIS — Z87891 Personal history of nicotine dependence: Secondary | ICD-10-CM | POA: Diagnosis not present

## 2017-02-19 DIAGNOSIS — Z79899 Other long term (current) drug therapy: Secondary | ICD-10-CM | POA: Diagnosis not present

## 2017-02-19 DIAGNOSIS — Z7982 Long term (current) use of aspirin: Secondary | ICD-10-CM | POA: Diagnosis not present

## 2017-02-19 DIAGNOSIS — I442 Atrioventricular block, complete: Secondary | ICD-10-CM | POA: Diagnosis not present

## 2017-02-19 DIAGNOSIS — I4891 Unspecified atrial fibrillation: Secondary | ICD-10-CM | POA: Diagnosis not present

## 2017-02-19 HISTORY — PX: CARDIOVERSION: SHX1299

## 2017-02-19 LAB — POCT I-STAT, CHEM 8
BUN: 23 mg/dL — ABNORMAL HIGH (ref 6–20)
Calcium, Ion: 1.13 mmol/L — ABNORMAL LOW (ref 1.15–1.40)
Chloride: 105 mmol/L (ref 101–111)
Creatinine, Ser: 1.1 mg/dL (ref 0.61–1.24)
GLUCOSE: 100 mg/dL — AB (ref 65–99)
HEMATOCRIT: 39 % (ref 39.0–52.0)
HEMOGLOBIN: 13.3 g/dL (ref 13.0–17.0)
POTASSIUM: 5.3 mmol/L — AB (ref 3.5–5.1)
SODIUM: 139 mmol/L (ref 135–145)
TCO2: 29 mmol/L (ref 0–100)

## 2017-02-19 SURGERY — CARDIOVERSION
Anesthesia: General

## 2017-02-19 MED ORDER — LIDOCAINE HCL (CARDIAC) 20 MG/ML IV SOLN
INTRAVENOUS | Status: DC | PRN
  Administered 2017-02-19: 60 mg via INTRATRACHEAL

## 2017-02-19 MED ORDER — SODIUM CHLORIDE 0.9 % IV SOLN
INTRAVENOUS | Status: DC
Start: 2017-02-19 — End: 2017-02-19
  Administered 2017-02-19: 10:00:00 via INTRAVENOUS

## 2017-02-19 MED ORDER — PROPOFOL 10 MG/ML IV BOLUS
INTRAVENOUS | Status: DC | PRN
  Administered 2017-02-19: 50 mg via INTRAVENOUS

## 2017-02-19 NOTE — Interval H&P Note (Signed)
History and Physical Interval Note:  02/19/2017 10:01 AM  Scott [name]  has presented today for surgery, with the diagnosis of AFLUTTER  The various methods of treatment have been discussed with the patient and family. After consideration of risks, benefits and other options for treatment, the patient has consented to  Procedure(s): CARDIOVERSION (N/A) as a surgical intervention .  The patient's history has been reviewed, patient examined, no change in status, stable for surgery.  I have reviewed the patient's chart and labs.  Questions were answered to the patient's satisfaction.     Mujahid Jalomo

## 2017-02-19 NOTE — H&P (View-Only) (Signed)
Patient Care Team: Hulan Fess, MD as PCP - General (Family Medicine)   HPI  Scott [name] is a 76 y.o. male Seen as an add-on today because of atrial flutter. He underwent pacemaker generator replacement a couple of weeks ago emergently. He was noted at that time to be in atrial flutter; discussions with Dr. Greggory Brandy  Prompted deicison to begin anticoagulation   That appt got snowed out  He is not clear that he is not back at his baseline following pacing; ie not sure that there are symptoms relaed to Afib   Records  and Results Reviewed   Past Medical History:  Diagnosis Date  . Atherosclerosis   . Complete heart block (HCC)    s/p PPM following AVR  . Hyperlipidemia   . Kidney calculus   . Kidney stones   . Obesity   . Pacemaker     Past Surgical History:  Procedure Laterality Date  . AORTIC ROOT REPLACEMENT    . AORTIC VALVE REPLACEMENT    . CARDIAC CATHETERIZATION  03/28/2007   OVERALL CARDIAC SIZE AND SILHOUETTE WERE NORMAL. GLOBAL FUNCTION AND REGIONAL WALL WERE NORMAL  . EP IMPLANTABLE DEVICE N/A 12/25/2016   Procedure: PPM Generator Changeout;  Surgeon: Deboraha Sprang, MD;  Location: [facility];  Service: Cardiovascular;  Laterality: N/A;  . INSERT / REPLACE / REMOVE PACEMAKER  03/2007    Current Outpatient Prescriptions  Medication Sig Dispense Refill  . apixaban (ELIQUIS) 5 MG TABS tablet Take 1 tablet (5 mg total) by mouth 2 (two) times daily. 60 tablet 0  . apixaban (ELIQUIS) 5 MG TABS tablet Take 1 tablet (5 mg total) by mouth 2 (two) times daily. 60 tablet 3  . aspirin EC 81 MG tablet Take 1 tablet (81 mg total) by mouth daily.    Marland Kitchen atorvastatin (LIPITOR) 20 MG tablet TAKE ONE TABLET BY MOUTH ONCE DAILY 90 tablet 0  . calcipotriene (DOVONOX) 0.005 % ointment Apply 1 application topically 2 (two) times daily.     . Cholecalciferol (VITAMIN D PO) Take 2,000 Units by mouth daily.     . DiphenhydrAMINE HCl, Sleep, (SIMPLY SLEEP PO) Take 1-2 tablets  by mouth at bedtime as needed. For sleep    . fish oil-omega-3 fatty acids 1000 MG capsule Take 1 g by mouth daily.     . metoprolol tartrate (LOPRESSOR) 25 MG tablet TAKE ONE TABLET BY MOUTH TWICE DAILY 180 tablet 3  . ranitidine (ZANTAC) 150 MG tablet Take 150 mg by mouth at bedtime as needed. For acid reflux    . timolol (TIMOPTIC) 0.5 % ophthalmic solution Place 1 drop into the right eye 2 (two) times daily.      No current facility-administered medications for this visit.     Allergies  Allergen Reactions  . Clindamycin/Lincomycin Itching    Unknown reaction  . Morphine Itching    Unknown reaction      Review of Systems negative except from HPI and PMH  Physical Exam HR 75 Well developed and well nourished in no acute distress HENT normal E scleral and icterus clear Neck Supple JVP flat; carotids brisk and full Clear to ausculation Device pocket well healed; without hematoma or erythema.  There is no tethering Regular rate and rhythm, no murmurs gallops or rub Soft with active bowel sounds No clubbing cyanosis   Edema Alert and oriented, grossly normal motor and sensory function Skin Warm and Dry    Assessment and  Plan  Atrial flutter   Aortic valve Bioprosthesis  Pacer medtronic   the patient has persistent atrial flutter. His CHADS-VASc score is greater than or equal to 3 (age-64 hypertension-1 ) we will initiate anticoagulation using apixaban adjunctive to his aspirin  as he has an aortic valve bioprosthesis   We will plan to undertake cardioversion in about 3 weeks with subsequent decisions as to antiarrhythmic therapy      Current medicines are reviewed at length with the patient today .  The patient does not  have concerns regarding medicines.

## 2017-02-19 NOTE — Anesthesia Postprocedure Evaluation (Signed)
Anesthesia Post Note  Patient: Scott [name]  Procedure(s) Performed: Procedure(s) (LRB): CARDIOVERSION (N/A)  Patient location during evaluation: PACU Anesthesia Type: General Level of consciousness: awake and alert Pain management: pain level controlled Vital Signs Assessment: post-procedure vital signs reviewed and stable Respiratory status: spontaneous breathing, nonlabored ventilation and respiratory function stable Cardiovascular status: blood pressure returned to baseline and stable Postop Assessment: no signs of nausea or vomiting Anesthetic complications: no       Last Vitals:  Vitals:   02/19/17 1129 02/19/17 1130  BP: 126/71 115/64  Pulse: 62 61  Resp: 13 17  Temp: 36.3 C     Last Pain:  Vitals:   02/19/17 1129  TempSrc: Oral                 Dillie Burandt,W. EDMOND

## 2017-02-19 NOTE — Discharge Instructions (Signed)
Electrical Cardioversion, Care After °This sheet gives you information about how to care for yourself after your procedure. Your health care provider may also give you more specific instructions. If you have problems or questions, contact your health care provider. °What can I expect after the procedure? °After the procedure, it is common to have: °· Some redness on the skin where the shocks were given. °Follow these instructions at home: °· Do not drive for 24 hours if you were given a medicine to help you relax (sedative). °· Take over-the-counter and prescription medicines only as told by your health care provider. °· Ask your health care provider how to check your pulse. Check it often. °· Rest for 48 hours after the procedure or as told by your health care provider. °· Avoid or limit your caffeine use as told by your health care provider. °Contact a health care provider if: °· You feel like your heart is beating too quickly or your pulse is not regular. °· You have a serious muscle cramp that does not go away. °Get help right away if: °· You have discomfort in your chest. °· You are dizzy or you feel faint. °· You have trouble breathing or you are short of breath. °· Your speech is slurred. °· You have trouble moving an arm or leg on one side of your body. °· Your fingers or toes turn cold or blue. °This information is not intended to replace advice given to you by your health care provider. Make sure you discuss any questions you have with your health care provider. °Document Released: 09/27/2013 Document Revised: 07/10/2016 Document Reviewed: 06/12/2016 °Elsevier Interactive Patient Education © 2017 [employer]vier Inc. ° °

## 2017-02-19 NOTE — Op Note (Signed)
Procedure: Electrical Cardioversion Indications:  Atrial Fibrillation  Procedure Details:  Consent: Risks of procedure as well as the alternatives and risks of each were explained to the (patient/caregiver).  Consent for procedure obtained.  Time Out: Verified patient identification, verified procedure, site/side was marked, verified correct patient position, special equipment/implants available, medications/allergies/relevent history reviewed, required imaging and test results available.  Performed  Patient placed on cardiac monitor, pulse oximetry, supplemental oxygen as necessary.  Sedation given: Propofol 50 mg IV, Dr. Ola Spurr Pacer pads placed anterior and posterior chest.  Cardioverted 1 time(s).  Cardioversion with synchronized biphasic 120J shock.  Evaluation: Findings: Post procedure EKG shows: Atrial sensed, ventricular paced Complications: None Patient did tolerate procedure well.  Normal pacemaker check after cardioversion.  Time Spent Directly with the Patient:  30 minutes   Scott [name] 02/19/2017, 11:27 AM

## 2017-02-19 NOTE — Transfer of Care (Signed)
Immediate Anesthesia Transfer of Care Note  Patient: Scott [name]  Procedure(s) Performed: Procedure(s): CARDIOVERSION (N/A)  Patient Location: Endoscopy Unit  Anesthesia Type:General  Level of Consciousness: awake, alert  and oriented  Airway & Oxygen Therapy: Patient Spontanous Breathing  Post-op Assessment: Report given to RN and Post -op Vital signs reviewed and stable  Post vital signs: Reviewed and stable  Last Vitals:  Vitals:   02/19/17 0954  BP: (!) 156/73  Pulse: 60  Resp: 10    Last Pain:  Vitals:   02/19/17 0954  TempSrc: Oral         Complications: No apparent anesthesia complications

## 2017-02-19 NOTE — Telephone Encounter (Signed)
Scheduled remote appt for 3/13. Attempted to call patient to notify him of this.  LMTCB/sss

## 2017-02-22 ENCOUNTER — Encounter (HOSPITAL_COMMUNITY): Payer: Self-pay | Admitting: Cardiovascular Disease

## 2017-03-04 ENCOUNTER — Telehealth: Payer: Self-pay | Admitting: Cardiology

## 2017-03-04 ENCOUNTER — Encounter: Payer: Medicare Other | Admitting: *Deleted

## 2017-03-04 NOTE — Telephone Encounter (Signed)
Attempted to confirm remote transmission with pt. No answer and was unable to leave a message.   

## 2017-03-05 ENCOUNTER — Encounter: Payer: Self-pay | Admitting: Cardiology

## 2017-03-11 ENCOUNTER — Other Ambulatory Visit: Payer: Self-pay | Admitting: Internal Medicine

## 2017-03-11 DIAGNOSIS — I442 Atrioventricular block, complete: Secondary | ICD-10-CM

## 2017-03-11 MED ORDER — ATORVASTATIN CALCIUM 20 MG PO TABS: 20.0000 mg | ORAL_TABLET | Freq: Every day | ORAL | 3 refills | Status: DC

## 2017-03-11 MED ORDER — APIXABAN 5 MG PO TABS: 5.0000 mg | ORAL_TABLET | Freq: Two times a day (BID) | ORAL | 1 refills | Status: DC

## 2017-03-11 MED ORDER — METOPROLOL TARTRATE 25 MG PO TABS: 25.0000 mg | ORAL_TABLET | Freq: Two times a day (BID) | ORAL | 11 refills | Status: DC

## 2017-03-11 NOTE — Telephone Encounter (Signed)
Pt requested Eliquis 5mg ; last seen by Dr. Caryl Comes on 01/21/17, Crea-1.10 on 02/19/17, Age-54, Wt-95.7kg. Will send request.

## 2017-03-11 NOTE — Telephone Encounter (Signed)
Pt's Rx was sent to pt's pharmacy as requested. Confirmation received.  °

## 2017-03-11 NOTE — Telephone Encounter (Signed)
New Message   *STAT* If patient is at the pharmacy, call can be transferred to refill team.   1. Which medications need to be refilled? (please list name of each medication and dose if known) Eliquis 5mg .... Metoprolol 25mg ..... Atorvastatin 20mg   2. Which pharmacy/location (including street and city if local pharmacy) is medication to be sent to? [facility] [address] [place] [address]  3. Do they need a 30 day or 90 day supply? [place]

## 2017-03-29 ENCOUNTER — Encounter: Payer: Self-pay | Admitting: Internal Medicine

## 2017-03-29 ENCOUNTER — Ambulatory Visit (INDEPENDENT_AMBULATORY_CARE_PROVIDER_SITE_OTHER): Payer: Medicare Other | Admitting: Internal Medicine

## 2017-03-29 VITALS — BP 132/82 | HR 74 | Ht 70.0 in | Wt 210.0 lb

## 2017-03-29 DIAGNOSIS — I442 Atrioventricular block, complete: Secondary | ICD-10-CM

## 2017-03-29 DIAGNOSIS — I10 Essential (primary) hypertension: Secondary | ICD-10-CM | POA: Diagnosis not present

## 2017-03-29 LAB — CUP PACEART INCLINIC DEVICE CHECK
Battery Impedance: 100 Ohm
Battery Voltage: 2.79 V
Brady Statistic AS VP Percent: 64 %
Date Time Interrogation Session: 20180409122845
Implantable Lead Location: 753860
Implantable Lead Model: 4092
Implantable Lead Model: 5076
Lead Channel Impedance Value: 950 Ohm
Lead Channel Pacing Threshold Amplitude: 0.75 V
Lead Channel Sensing Intrinsic Amplitude: 2 mV
Lead Channel Setting Pacing Amplitude: 2.5 V
Lead Channel Setting Pacing Pulse Width: 0.4 ms
MDC IDC LEAD IMPLANT DT: 20080423
MDC IDC LEAD IMPLANT DT: 20080423
MDC IDC LEAD LOCATION: 753859
MDC IDC MSMT BATTERY REMAINING LONGEVITY: 143 mo
MDC IDC MSMT LEADCHNL RA IMPEDANCE VALUE: 545 Ohm
MDC IDC MSMT LEADCHNL RA PACING THRESHOLD AMPLITUDE: 0.625 V
MDC IDC MSMT LEADCHNL RA PACING THRESHOLD PULSEWIDTH: 0.4 ms
MDC IDC MSMT LEADCHNL RA PACING THRESHOLD PULSEWIDTH: 0.4 ms
MDC IDC MSMT LEADCHNL RV PACING THRESHOLD AMPLITUDE: 0.75 V
MDC IDC MSMT LEADCHNL RV PACING THRESHOLD AMPLITUDE: 0.75 V
MDC IDC MSMT LEADCHNL RV PACING THRESHOLD PULSEWIDTH: 0.4 ms
MDC IDC MSMT LEADCHNL RV PACING THRESHOLD PULSEWIDTH: 0.4 ms
MDC IDC PG IMPLANT DT: 20180105
MDC IDC SET LEADCHNL RA PACING AMPLITUDE: 2 V
MDC IDC SET LEADCHNL RV SENSING SENSITIVITY: 2.8 mV
MDC IDC STAT BRADY AP VP PERCENT: 36 %
MDC IDC STAT BRADY AP VS PERCENT: 0 %
MDC IDC STAT BRADY AS VS PERCENT: 0 %

## 2017-03-29 NOTE — Patient Instructions (Signed)
Medication Instructions:  Your physician recommends that you continue on your current medications as directed. Please refer to the Current Medication list given to you today.   Labwork: None ordered   Testing/Procedures: None ordered   Follow-Up: Remote monitoring is used to monitor your Pacemaker from home. This monitoring reduces the number of office visits required to check your device to one time per year. It allows [place] to keep an eye on the functioning of your device to ensure it is working properly. You are scheduled for a device check from home on 06/28/17. You may send your transmission at any time that day. If you have a wireless device, the transmission will be sent automatically. After your physician reviews your transmission, you will receive a postcard with your next transmission date.   Your physician wants you to follow-up in: 6 months with Chanetta Marshall, NP You will receive a reminder letter in the mail two months in advance. If you don't receive a letter, please call our office to schedule the follow-up appointment.   Any Other Special Instructions Will Be Listed Below (If Applicable).     If you need a refill on your cardiac medications before your next appointment, please call your pharmacy.

## 2017-03-29 NOTE — Progress Notes (Signed)
PCP: Gennette Pac, MD Primary Cardiologist:  Dr Aundra Dubin Electrophysiologist: Damarys Speir  Scott [name] is a 76 y.o. male who presents today for routine electrophysiology followup.  Since his recent generator change, the patient reports doing very well.  He presented at EOL with symptoms of fatigue (he had been noncompliant with remotes).  He was evaluated and underwent urgent device replacement by Dr Caryl Comes.  He reports resolution of symptoms post generator change.  He was also noted to have atypical atrial flutter for which he was placed on eliquis and underwent cardioversion 02/2017.  He did not any clinical benefit with cardioversion.  His concern today with eliquis is costs. Today, he denies symptoms of palpitations, chest pain, shortness of breath,  lower extremity edema, dizziness, presyncope, or syncope.  The patient is otherwise without complaint today.   Past Medical History:  Diagnosis Date  . Atherosclerosis   . Atypical atrial flutter ([place])   . Complete heart block (HCC)    s/p PPM following AVR  . Hyperlipidemia   . Hypertensive cardiovascular disease   . Kidney calculus   . Kidney stones   . Obesity   . Valvular heart disease    Past Surgical History:  Procedure Laterality Date  . AORTIC ROOT REPLACEMENT    . AORTIC VALVE REPLACEMENT    . CARDIAC CATHETERIZATION  03/28/2007   OVERALL CARDIAC SIZE AND SILHOUETTE WERE NORMAL. GLOBAL FUNCTION AND REGIONAL WALL WERE NORMAL  . CARDIOVERSION N/A 02/19/2017   Procedure: CARDIOVERSION;  Surgeon: Sanda Klein, MD;  Location: [facility];  Service: Cardiovascular;  Laterality: N/A;  . EP IMPLANTABLE DEVICE N/A 12/25/2016   Procedure: PPM Generator Changeout;  Surgeon: Deboraha Sprang, MD; complete heart block (MDT Adapta L)  . INSERT / REPLACE / REMOVE PACEMAKER  03/2007    Current Outpatient Prescriptions  Medication Sig Dispense Refill  . apixaban (ELIQUIS) 5 MG TABS tablet Take 1 tablet (5 mg total) by mouth 2 (two) times  daily. 180 tablet 1  . aspirin EC 81 MG tablet Take 1 tablet (81 mg total) by mouth daily.    Marland Kitchen atorvastatin (LIPITOR) 20 MG tablet Take 1 tablet (20 mg total) by mouth daily. 90 tablet 3  . calcipotriene (DOVONOX) 0.005 % ointment Apply 1 application topically 2 (two) times daily.     . Cholecalciferol (VITAMIN D PO) Take 2,000 Units by mouth daily.     . DiphenhydrAMINE HCl, Sleep, (SIMPLY SLEEP PO) Take 1-2 tablets by mouth at bedtime as needed. For sleep    . fish oil-omega-3 fatty acids 1000 MG capsule Take 1 g by mouth daily.     . metoprolol tartrate (LOPRESSOR) 25 MG tablet Take 1 tablet (25 mg total) by mouth 2 (two) times daily. 60 tablet 11  . ranitidine (ZANTAC) 150 MG tablet Take 150 mg by mouth at bedtime as needed. For acid reflux    . timolol (TIMOPTIC) 0.5 % ophthalmic solution Place 1 drop into the right eye 2 (two) times daily.      No current facility-administered medications for this visit.     Physical Exam: Vitals:   03/29/17 1106  BP: 132/82  Pulse: 74  SpO2: 95%  Weight: 210 lb (95.3 kg)  Height: 5\' 10"  (1.778 m)    GEN- The patient is well appearing, alert and oriented x 3 today.   Head- normocephalic, atraumatic Eyes-  Sclera clear, conjunctiva pink Ears- hearing intact Oropharynx- clear Lungs- Clear to ausculation bilaterally, normal work of breathing Chest- pacemaker  pocket is well healed Heart- Regular rate and rhythm, no m/r/g GI- soft, NT, ND, + BS Extremities- no clubbing, cyanosis, or edema  Pacemaker interrogation- reviewed in detail today,  [name] PACEART report ekg today reveals AV paced rhythm  Assessment and Plan: 1.  Complete heart block  Normal pacemaker function [name] Pace Art report No changes today  2.  Hypertensive cardiovascular disease Stable  No change required today   3. Atypical atrial flutter On eliquis and ASA as per Dr Caryl Comes Given costs, he is considering switching to coumadin.  Given valvular heart disease, this is  probably reasonable.  He will contact our office if he decides to change  4. s/p tissue AVR Stable No change required today  carelink Return to [name] EP NP every 6-12 months  Thompson Grayer MD, [facility] 03/29/2017 11:39 AM

## 2017-04-01 DIAGNOSIS — D485 Neoplasm of uncertain behavior of skin: Secondary | ICD-10-CM | POA: Diagnosis not present

## 2017-04-01 DIAGNOSIS — L400 Psoriasis vulgaris: Secondary | ICD-10-CM | POA: Diagnosis not present

## 2017-04-01 DIAGNOSIS — L821 Other seborrheic keratosis: Secondary | ICD-10-CM | POA: Diagnosis not present

## 2017-04-01 DIAGNOSIS — Z85828 Personal history of other malignant neoplasm of skin: Secondary | ICD-10-CM | POA: Diagnosis not present

## 2017-04-01 DIAGNOSIS — D692 Other nonthrombocytopenic purpura: Secondary | ICD-10-CM | POA: Diagnosis not present

## 2017-04-01 DIAGNOSIS — D1801 Hemangioma of skin and subcutaneous tissue: Secondary | ICD-10-CM | POA: Diagnosis not present

## 2017-04-01 DIAGNOSIS — L570 Actinic keratosis: Secondary | ICD-10-CM | POA: Diagnosis not present

## 2017-04-01 DIAGNOSIS — L720 Epidermal cyst: Secondary | ICD-10-CM | POA: Diagnosis not present

## 2017-06-10 DIAGNOSIS — C44329 Squamous cell carcinoma of skin of other parts of face: Secondary | ICD-10-CM | POA: Diagnosis not present

## 2017-06-10 DIAGNOSIS — D485 Neoplasm of uncertain behavior of skin: Secondary | ICD-10-CM | POA: Diagnosis not present

## 2017-06-10 DIAGNOSIS — L720 Epidermal cyst: Secondary | ICD-10-CM | POA: Diagnosis not present

## 2017-06-10 DIAGNOSIS — Z85828 Personal history of other malignant neoplasm of skin: Secondary | ICD-10-CM | POA: Diagnosis not present

## 2017-06-10 DIAGNOSIS — H401113 Primary open-angle glaucoma, right eye, severe stage: Secondary | ICD-10-CM | POA: Diagnosis not present

## 2017-06-24 DIAGNOSIS — D485 Neoplasm of uncertain behavior of skin: Secondary | ICD-10-CM | POA: Diagnosis not present

## 2017-06-24 DIAGNOSIS — L720 Epidermal cyst: Secondary | ICD-10-CM | POA: Diagnosis not present

## 2017-06-24 DIAGNOSIS — Z85828 Personal history of other malignant neoplasm of skin: Secondary | ICD-10-CM | POA: Diagnosis not present

## 2017-06-28 ENCOUNTER — Telehealth: Payer: Self-pay | Admitting: Cardiology

## 2017-06-28 ENCOUNTER — Ambulatory Visit (INDEPENDENT_AMBULATORY_CARE_PROVIDER_SITE_OTHER): Payer: Medicare Other | Admitting: *Deleted

## 2017-06-28 DIAGNOSIS — I442 Atrioventricular block, complete: Secondary | ICD-10-CM | POA: Diagnosis not present

## 2017-06-28 NOTE — Telephone Encounter (Signed)
Spoke with pt and reminded pt of remote transmission that is due today. Pt verbalized understanding.   

## 2017-06-28 NOTE — Progress Notes (Signed)
Remote pacemaker transmission.   

## 2017-06-29 LAB — CUP PACEART REMOTE DEVICE CHECK
Battery Impedance: 100 Ohm
Battery Remaining Longevity: 145 mo
Battery Voltage: 2.79 V
Brady Statistic AP VP Percent: 29 %
Implantable Lead Implant Date: 20080423
Implantable Lead Location: 753859
Implantable Lead Location: 753860
Implantable Lead Model: 4092
Implantable Lead Model: 5076
Implantable Pulse Generator Implant Date: 20180105
Lead Channel Impedance Value: 535 Ohm
Lead Channel Impedance Value: 975 Ohm
Lead Channel Setting Pacing Amplitude: 2.5 V
Lead Channel Setting Pacing Pulse Width: 0.4 ms
Lead Channel Setting Sensing Sensitivity: 2.8 mV
MDC IDC LEAD IMPLANT DT: 20080423
MDC IDC MSMT LEADCHNL RA PACING THRESHOLD AMPLITUDE: 0.75 V
MDC IDC MSMT LEADCHNL RA PACING THRESHOLD PULSEWIDTH: 0.4 ms
MDC IDC MSMT LEADCHNL RV PACING THRESHOLD AMPLITUDE: 0.75 V
MDC IDC MSMT LEADCHNL RV PACING THRESHOLD PULSEWIDTH: 0.4 ms
MDC IDC SESS DTM: 20180709163450
MDC IDC SET LEADCHNL RA PACING AMPLITUDE: 2 V
MDC IDC STAT BRADY AP VS PERCENT: 0 %
MDC IDC STAT BRADY AS VP PERCENT: 71 %
MDC IDC STAT BRADY AS VS PERCENT: 0 %

## 2017-07-02 ENCOUNTER — Encounter: Payer: Self-pay | Admitting: Cardiology

## 2017-08-27 DIAGNOSIS — L400 Psoriasis vulgaris: Secondary | ICD-10-CM | POA: Diagnosis not present

## 2017-08-27 DIAGNOSIS — L298 Other pruritus: Secondary | ICD-10-CM | POA: Diagnosis not present

## 2017-09-10 DIAGNOSIS — L400 Psoriasis vulgaris: Secondary | ICD-10-CM | POA: Diagnosis not present

## 2017-09-14 DIAGNOSIS — Z23 Encounter for immunization: Secondary | ICD-10-CM | POA: Diagnosis not present

## 2017-09-27 ENCOUNTER — Telehealth: Payer: Self-pay | Admitting: Cardiology

## 2017-09-27 ENCOUNTER — Ambulatory Visit (INDEPENDENT_AMBULATORY_CARE_PROVIDER_SITE_OTHER): Payer: Medicare Other | Admitting: *Deleted

## 2017-09-27 DIAGNOSIS — I442 Atrioventricular block, complete: Secondary | ICD-10-CM

## 2017-09-27 NOTE — Telephone Encounter (Signed)
Spoke with pt and reminded pt of remote transmission that is due today. Pt verbalized understanding.   

## 2017-09-28 LAB — CUP PACEART REMOTE DEVICE CHECK
Battery Impedance: 100 Ohm
Brady Statistic AP VP Percent: 27 %
Brady Statistic AP VS Percent: 0 %
Brady Statistic AS VP Percent: 73 %
Brady Statistic AS VS Percent: 0 %
Date Time Interrogation Session: 20181008161411
Implantable Lead Implant Date: 20080423
Implantable Lead Location: 753860
Implantable Lead Model: 4092
Implantable Lead Model: 5076
Lead Channel Impedance Value: 537 Ohm
Lead Channel Impedance Value: 950 Ohm
Lead Channel Pacing Threshold Amplitude: 0.75 V
Lead Channel Pacing Threshold Pulse Width: 0.4 ms
Lead Channel Setting Pacing Amplitude: 2.5 V
MDC IDC LEAD IMPLANT DT: 20080423
MDC IDC LEAD LOCATION: 753859
MDC IDC MSMT BATTERY REMAINING LONGEVITY: 145 mo
MDC IDC MSMT BATTERY VOLTAGE: 2.79 V
MDC IDC MSMT LEADCHNL RV PACING THRESHOLD AMPLITUDE: 0.75 V
MDC IDC MSMT LEADCHNL RV PACING THRESHOLD PULSEWIDTH: 0.4 ms
MDC IDC PG IMPLANT DT: 20180105
MDC IDC SET LEADCHNL RA PACING AMPLITUDE: 2 V
MDC IDC SET LEADCHNL RV PACING PULSEWIDTH: 0.4 ms
MDC IDC SET LEADCHNL RV SENSING SENSITIVITY: 2.8 mV

## 2017-09-28 NOTE — Progress Notes (Signed)
Remote pacemaker transmission.   

## 2017-09-29 ENCOUNTER — Ambulatory Visit: Payer: Medicare Other | Admitting: Nurse Practitioner

## 2017-09-30 ENCOUNTER — Encounter: Payer: Self-pay | Admitting: Cardiology

## 2017-10-03 NOTE — Progress Notes (Addendum)
Cardiology Office Note Date:  10/04/2017  Patient ID:  Scott, [name] [birth year], MRN [id number] PCP:  Hulan Fess, MD  Cardiologist:  Dr.McLean (no longer follows) Electrophysiologist: Dr. Rayann Heman    Chief Complaint: routine visit  History of Present Illness: Scott [name] is a 76 y.o. male with history of CHB w/PPM, atypical AFlutter w/DCCV (noting no clar change in symptomatology in SR vs AFlutter). HTN, HLD, and VHD w/bioprosthetic AVR.  He comes today to be seen for Dr. Rayann Heman, last seen by him in April at that time doing well though had concerns with cost of his Eliquis with consideration of warfarin.  He is doing very well.  He goes to the gym 3x week, doing some weights and cardio with excellent exertional capacity.  No CP, palpitations, no dizziness, near syncope or syncope.  No bleeding or signs of bleeding.  He stopped his ASA tx 2/2 easy and significant bruising with minimal trauma.  Tells me he is not willing to consider warfarin and the Eliquis is covered for him.  We discussed rational of warfarin with AFlutter/VHD as well as a/c + ASA with AVR.  He has moved to [place], [place].  Device information: MDT dual chamber PPM, implanted 04/13/07, gen change 12/25/16 V paces at 40bpm today   Past Medical History:  Diagnosis Date  . Atherosclerosis   . Atypical atrial flutter ([place])   . Complete heart block (HCC)    s/p PPM following AVR  . Hyperlipidemia   . Hypertensive cardiovascular disease   . Kidney calculus   . Kidney stones   . Obesity   . Valvular heart disease     Past Surgical History:  Procedure Laterality Date  . AORTIC ROOT REPLACEMENT    . AORTIC VALVE REPLACEMENT    . CARDIAC CATHETERIZATION  03/28/2007   OVERALL CARDIAC SIZE AND SILHOUETTE WERE NORMAL. GLOBAL FUNCTION AND REGIONAL WALL WERE NORMAL  . CARDIOVERSION N/A 02/19/2017   Procedure: CARDIOVERSION;  Surgeon: Sanda Klein, MD;  Location: [facility];  Service: Cardiovascular;  Laterality:  N/A;  . EP IMPLANTABLE DEVICE N/A 12/25/2016   Procedure: PPM Generator Changeout;  Surgeon: Deboraha Sprang, MD; complete heart block (MDT Adapta L)  . INSERT / REPLACE / REMOVE PACEMAKER  03/2007    Current Outpatient Prescriptions  Medication Sig Dispense Refill  . apixaban (ELIQUIS) 5 MG TABS tablet Take 1 tablet (5 mg total) by mouth 2 (two) times daily. 180 tablet 1  . aspirin EC 81 MG tablet Take 1 tablet (81 mg total) by mouth daily.    Marland Kitchen atorvastatin (LIPITOR) 20 MG tablet Take 1 tablet (20 mg total) by mouth daily. 90 tablet 3  . calcipotriene (DOVONOX) 0.005 % ointment Apply 1 application topically 2 (two) times daily.     . Cholecalciferol (VITAMIN D PO) Take 2,000 Units by mouth daily.     . DiphenhydrAMINE HCl, Sleep, (SIMPLY SLEEP PO) Take 1-2 tablets by mouth at bedtime as needed. For sleep    . fish oil-omega-3 fatty acids 1000 MG capsule Take 1 g by mouth daily.     . metoprolol tartrate (LOPRESSOR) 25 MG tablet Take 1 tablet (25 mg total) by mouth 2 (two) times daily. 60 tablet 11  . ranitidine (ZANTAC) 150 MG tablet Take 150 mg by mouth at bedtime as needed. For acid reflux    . timolol (TIMOPTIC) 0.5 % ophthalmic solution Place 1 drop into the right eye 2 (two) times daily.  No current facility-administered medications for this visit.     Allergies:   Clindamycin/lincomycin and Morphine   Social History:  The patient  reports that he quit smoking about 32 years ago. His smoking use included Cigarettes. He has a 45.00 pack-year smoking history. He has never used smokeless tobacco. He reports that he does not drink alcohol or use drugs.   Family History:  The patient's family history includes Heart attack in his father.  ROS:  Please see the history of present illness.  All other systems are reviewed and otherwise negative.   PHYSICAL EXAM:  VS:  BP 124/82   Pulse 87   Ht 5\' 10"  (1.778 m)   Wt 213 lb (96.6 kg)   BMI 30.56 kg/m  BMI: Body mass index is 30.56  kg/m. Well nourished, well developed, in no acute distress  HEENT: normocephalic, atraumatic  Neck: no JVD, carotid bruits or masses Cardiac:  RRR; no significant murmurs, no rubs, or gallops Lungs:  CTA b/l, no wheezing, rhonchi or rales  Abd: soft, non-tender, obese MS: no deformity or atrophy Ext: no edema  Skin: warm and dry, no rash Neuro:  No gross deficits appreciated Psych: euthymic mood, full affect  PPM site is stable, no tethering or discomfort   EKG:   Not done today PPM interrogation done today and reviewed by myself: battery and lead measurements are good.  AMS episodes EGM available are PACs and ATach, one 10beat NSVT  08/15/14 TTE Study Conclusions - Left ventricle: The cavity size was normal. Systolic function was normal. The estimated ejection fraction was in the range of 50% to 55%. Wall motion was normal; there were no regional wall motion abnormalities. Doppler parameters are consistent with abnormal left ventricular relaxation (grade 1 diastolic dysfunction). - Aortic valve: There was trivial regurgitation. Mean gradient (S): 5 mm Hg. Peak gradient (S): 10 mm Hg. - Left atrium: The atrium was mildly dilated. - Right atrium: The atrium was mildly dilated. - Pulmonary arteries: Systolic pressure was moderately increased. PA peak pressure: 42 mm Hg (S).   Recent Labs: 12/25/2016: Platelets 153 02/19/2017: BUN 23; Creatinine, Ser 1.10; Hemoglobin 13.3; Potassium 5.3; Sodium 139  No results found for requested labs within last 8760 hours.   CrCl cannot be calculated (Patient's most recent lab result is older than the maximum 21 days allowed.).   Wt Readings from Last 3 Encounters:  10/04/17 213 lb (96.6 kg)  03/29/17 210 lb (95.3 kg)  02/19/17 205 lb (93 kg)     Other studies reviewed: Additional studies/records reviewed today include: summarized above  ASSESSMENT AND PLAN:  1. PPM     Intact function, no changes made     Paces at 40  today     2. VHD w/AVR     Excellent exertional capacity, no symptoms  3. Atypical Aflutter     Notes reports unchanged symptoms regradless of rhythm     CHA2DS2Vasc is at least 3, on Eliquis  The patient does not want to consider warfarin, and feels most comfortable off ASA therapy with concerns of severe bruising he observed CBC, BMET today  4. HTN     Looks OK, no changes   Disposition: F/u with remotes, he would like to keep annual visits since moving, he will get established with local PMD, for now would like to keep cardiac/device care here   Current medicines are reviewed at length with the patient today.  The patient did not have any concerns regarding medicines.  Venetia Night, PA-C 10/04/2017 10:30 AM     [facility] [place] [address] [place] [address] [phone] (office)  [phone] (fax)

## 2017-10-04 ENCOUNTER — Ambulatory Visit (INDEPENDENT_AMBULATORY_CARE_PROVIDER_SITE_OTHER): Payer: Medicare Other | Admitting: Physician Assistant

## 2017-10-04 VITALS — BP 124/82 | HR 87 | Ht 70.0 in | Wt 213.0 lb

## 2017-10-04 DIAGNOSIS — I10 Essential (primary) hypertension: Secondary | ICD-10-CM

## 2017-10-04 DIAGNOSIS — Z950 Presence of cardiac pacemaker: Secondary | ICD-10-CM | POA: Diagnosis not present

## 2017-10-04 DIAGNOSIS — Z952 Presence of prosthetic heart valve: Secondary | ICD-10-CM | POA: Diagnosis not present

## 2017-10-04 DIAGNOSIS — Z79899 Other long term (current) drug therapy: Secondary | ICD-10-CM

## 2017-10-04 DIAGNOSIS — I484 Atypical atrial flutter: Secondary | ICD-10-CM | POA: Diagnosis not present

## 2017-10-04 LAB — BASIC METABOLIC PANEL
BUN / CREAT RATIO: 11 (ref 10–24)
BUN: 11 mg/dL (ref 8–27)
CO2: 25 mmol/L (ref 20–29)
CREATININE: 1.02 mg/dL (ref 0.76–1.27)
Calcium: 9.6 mg/dL (ref 8.6–10.2)
Chloride: 100 mmol/L (ref 96–106)
GFR calc Af Amer: 81 mL/min/{1.73_m2} (ref >59)
GFR, EST NON AFRICAN AMERICAN: 70 mL/min/{1.73_m2} (ref >59)
Glucose: 156 mg/dL — ABNORMAL HIGH (ref 65–99)
POTASSIUM: 4.4 mmol/L (ref 3.5–5.2)
SODIUM: 141 mmol/L (ref 134–144)

## 2017-10-04 LAB — CBC
Hematocrit: 42.5 % (ref 37.5–51.0)
Hemoglobin: 14.9 g/dL (ref 13.0–17.7)
MCH: 33.3 pg — AB (ref 26.6–33.0)
MCHC: 35.1 g/dL (ref 31.5–35.7)
MCV: 95 fL (ref 79–97)
PLATELETS: 150 10*3/uL (ref 150–379)
RBC: 4.47 x10E6/uL (ref 4.14–5.80)
RDW: 12.8 % (ref 12.3–15.4)
WBC: 8.8 10*3/uL (ref 3.4–10.8)

## 2017-10-04 NOTE — Patient Instructions (Addendum)
Medication Instructions:   Your physician recommends that you continue on your current medications as directed. Please refer to the Current Medication list given to you today.   If you need a refill on your cardiac medications before your next appointment, please call your pharmacy.  Labwork: BMET AND CBC TODAY    Testing/Procedures: NONE ORDERED  TODAY    Follow-Up:  Your physician wants you to follow-up in: [place] / [facility]  You will receive a reminder letter in the mail two months in advance. If you don't receive a letter, please call our office to schedule the follow-up appointment.   Remote monitoring is used to monitor your Pacemaker of ICD from home. This monitoring reduces the number of office visits required to check your device to one time per year. It allows [place] to keep an eye on the functioning of your device to ensure it is working properly. You are scheduled for a device check from home on 12-27-16 .You may send your transmission at any time that day. If you have a wireless device, the transmission will be sent automatically. After your physician reviews your transmission, you will receive a postcard with your next transmission date.    Any Other Special Instructions Will Be Listed Below (If Applicable).

## 2017-10-12 DIAGNOSIS — J019 Acute sinusitis, unspecified: Secondary | ICD-10-CM | POA: Diagnosis not present

## 2017-11-15 DIAGNOSIS — D225 Melanocytic nevi of trunk: Secondary | ICD-10-CM | POA: Diagnosis not present

## 2017-11-15 DIAGNOSIS — L820 Inflamed seborrheic keratosis: Secondary | ICD-10-CM | POA: Diagnosis not present

## 2017-11-15 DIAGNOSIS — R208 Other disturbances of skin sensation: Secondary | ICD-10-CM | POA: Diagnosis not present

## 2017-11-15 DIAGNOSIS — L218 Other seborrheic dermatitis: Secondary | ICD-10-CM | POA: Diagnosis not present

## 2017-11-15 DIAGNOSIS — L578 Other skin changes due to chronic exposure to nonionizing radiation: Secondary | ICD-10-CM | POA: Diagnosis not present

## 2017-11-15 DIAGNOSIS — L570 Actinic keratosis: Secondary | ICD-10-CM | POA: Diagnosis not present

## 2017-11-15 DIAGNOSIS — L538 Other specified erythematous conditions: Secondary | ICD-10-CM | POA: Diagnosis not present

## 2017-11-15 DIAGNOSIS — L298 Other pruritus: Secondary | ICD-10-CM | POA: Diagnosis not present

## 2017-11-15 DIAGNOSIS — L814 Other melanin hyperpigmentation: Secondary | ICD-10-CM | POA: Diagnosis not present

## 2017-12-20 DIAGNOSIS — L570 Actinic keratosis: Secondary | ICD-10-CM | POA: Diagnosis not present

## 2017-12-20 DIAGNOSIS — L820 Inflamed seborrheic keratosis: Secondary | ICD-10-CM | POA: Diagnosis not present

## 2017-12-20 DIAGNOSIS — L578 Other skin changes due to chronic exposure to nonionizing radiation: Secondary | ICD-10-CM | POA: Diagnosis not present

## 2017-12-20 DIAGNOSIS — D225 Melanocytic nevi of trunk: Secondary | ICD-10-CM | POA: Diagnosis not present

## 2017-12-20 DIAGNOSIS — C44729 Squamous cell carcinoma of skin of left lower limb, including hip: Secondary | ICD-10-CM | POA: Diagnosis not present

## 2017-12-20 DIAGNOSIS — D492 Neoplasm of unspecified behavior of bone, soft tissue, and skin: Secondary | ICD-10-CM | POA: Diagnosis not present

## 2017-12-20 DIAGNOSIS — L538 Other specified erythematous conditions: Secondary | ICD-10-CM | POA: Diagnosis not present

## 2017-12-20 DIAGNOSIS — L814 Other melanin hyperpigmentation: Secondary | ICD-10-CM | POA: Diagnosis not present

## 2017-12-20 DIAGNOSIS — L298 Other pruritus: Secondary | ICD-10-CM | POA: Diagnosis not present

## 2017-12-24 DIAGNOSIS — J329 Chronic sinusitis, unspecified: Secondary | ICD-10-CM | POA: Diagnosis not present

## 2017-12-27 ENCOUNTER — Ambulatory Visit (INDEPENDENT_AMBULATORY_CARE_PROVIDER_SITE_OTHER): Payer: Medicare Other | Admitting: *Deleted

## 2017-12-27 DIAGNOSIS — I442 Atrioventricular block, complete: Secondary | ICD-10-CM | POA: Diagnosis not present

## 2017-12-30 ENCOUNTER — Encounter: Payer: Self-pay | Admitting: Cardiology

## 2017-12-30 NOTE — Progress Notes (Signed)
Remote pacemaker transmission.   

## 2018-01-01 LAB — CUP PACEART REMOTE DEVICE CHECK
Battery Remaining Longevity: 142 mo
Battery Voltage: 2.78 V
Brady Statistic AS VP Percent: 76 %
Implantable Lead Implant Date: 20080423
Implantable Lead Implant Date: 20080423
Implantable Lead Location: 753859
Implantable Lead Model: 5076
Implantable Pulse Generator Implant Date: 20180105
Lead Channel Impedance Value: 580 Ohm
Lead Channel Impedance Value: 945 Ohm
Lead Channel Pacing Threshold Amplitude: 0.625 V
Lead Channel Pacing Threshold Amplitude: 0.625 V
Lead Channel Pacing Threshold Pulse Width: 0.4 ms
Lead Channel Pacing Threshold Pulse Width: 0.4 ms
Lead Channel Setting Pacing Amplitude: 2 V
Lead Channel Setting Pacing Pulse Width: 0.4 ms
Lead Channel Setting Sensing Sensitivity: 2.8 mV
MDC IDC LEAD LOCATION: 753860
MDC IDC MSMT BATTERY IMPEDANCE: 111 Ohm
MDC IDC SESS DTM: 20190109185039
MDC IDC SET LEADCHNL RV PACING AMPLITUDE: 2.5 V
MDC IDC STAT BRADY AP VP PERCENT: 24 %
MDC IDC STAT BRADY AP VS PERCENT: 0 %
MDC IDC STAT BRADY AS VS PERCENT: 0 %

## 2018-01-04 DIAGNOSIS — L988 Other specified disorders of the skin and subcutaneous tissue: Secondary | ICD-10-CM | POA: Diagnosis not present

## 2018-01-04 DIAGNOSIS — C44729 Squamous cell carcinoma of skin of left lower limb, including hip: Secondary | ICD-10-CM | POA: Diagnosis not present

## 2018-01-13 DIAGNOSIS — H401113 Primary open-angle glaucoma, right eye, severe stage: Secondary | ICD-10-CM | POA: Diagnosis not present

## 2018-02-21 ENCOUNTER — Ambulatory Visit (INDEPENDENT_AMBULATORY_CARE_PROVIDER_SITE_OTHER): Payer: Medicare Other | Admitting: Internal Medicine

## 2018-02-21 ENCOUNTER — Encounter (INDEPENDENT_AMBULATORY_CARE_PROVIDER_SITE_OTHER): Payer: Self-pay

## 2018-02-21 VITALS — BP 138/79 | HR 67 | Resp 14 | Ht 70.0 in | Wt 216.0 lb

## 2018-02-21 DIAGNOSIS — T82118A Breakdown (mechanical) of other cardiac electronic device, initial encounter: Secondary | ICD-10-CM | POA: Diagnosis not present

## 2018-02-21 DIAGNOSIS — I442 Atrioventricular block, complete: Secondary | ICD-10-CM

## 2018-02-21 DIAGNOSIS — Z950 Presence of cardiac pacemaker: Secondary | ICD-10-CM | POA: Diagnosis not present

## 2018-02-21 LAB — CBC WITH DIFFERENTIAL/PLATELET
BASOS ABS: 0 10*3/uL (ref 0.0–0.2)
Basos: 0 %
EOS (ABSOLUTE): 0.2 10*3/uL (ref 0.0–0.4)
Eos: 3 %
HEMOGLOBIN: 14.1 g/dL (ref 13.0–17.7)
Hematocrit: 40.9 % (ref 37.5–51.0)
IMMATURE GRANS (ABS): 0 10*3/uL (ref 0.0–0.1)
Immature Granulocytes: 0 %
LYMPHS: 29 %
Lymphocytes Absolute: 2.1 10*3/uL (ref 0.7–3.1)
MCH: 33.1 pg — ABNORMAL HIGH (ref 26.6–33.0)
MCHC: 34.5 g/dL (ref 31.5–35.7)
MCV: 96 fL (ref 79–97)
MONOCYTES: 6 %
Monocytes Absolute: 0.4 10*3/uL (ref 0.1–0.9)
Neutrophils Absolute: 4.3 10*3/uL (ref 1.4–7.0)
Neutrophils: 62 %
PLATELETS: 157 10*3/uL (ref 150–379)
RBC: 4.26 x10E6/uL (ref 4.14–5.80)
RDW: 14.1 % (ref 12.3–15.4)
WBC: 7.1 10*3/uL (ref 3.4–10.8)

## 2018-02-21 LAB — BASIC METABOLIC PANEL
BUN/Creatinine Ratio: 13 (ref 10–24)
BUN: 14 mg/dL (ref 8–27)
CALCIUM: 9.2 mg/dL (ref 8.6–10.2)
CHLORIDE: 103 mmol/L (ref 96–106)
CO2: 25 mmol/L (ref 20–29)
Creatinine, Ser: 1.07 mg/dL (ref 0.76–1.27)
GFR calc non Af Amer: 66 mL/min/{1.73_m2} (ref >59)
GFR, EST AFRICAN AMERICAN: 76 mL/min/{1.73_m2} (ref >59)
Glucose: 198 mg/dL — ABNORMAL HIGH (ref 65–99)
POTASSIUM: 4.4 mmol/L (ref 3.5–5.2)
Sodium: 141 mmol/L (ref 134–144)

## 2018-02-21 NOTE — H&P (View-Only) (Signed)
Patient Care Team: Hulan Fess, MD as PCP - General (Family Medicine)   HPI  Scott [name] is a 77 y.o. male Seen in follow-up for pacemaker implanted in the context of complete heart block.  Unfortunately, he received a Medtronic device is now on advisory recall with recommendations for device generator replacement was patients were device dependent.  He has a history of atrial arrhythmias for which he underwent catheter ablation for atrial flutter.  The patient denies chest pain, shortness of breath, nocturnal dyspnea, orthopnea or peripheral edema.  There have been no palpitations, lightheadedness or syncope.      Past Medical History:  Diagnosis Date  . Atherosclerosis   . Atypical atrial flutter ([place])   . Complete heart block (HCC)    s/p PPM following AVR  . Hyperlipidemia   . Hypertensive cardiovascular disease   . Kidney calculus   . Kidney stones   . Obesity   . Valvular heart disease     Past Surgical History:  Procedure Laterality Date  . AORTIC ROOT REPLACEMENT    . AORTIC VALVE REPLACEMENT    . CARDIAC CATHETERIZATION  03/28/2007   OVERALL CARDIAC SIZE AND SILHOUETTE WERE NORMAL. GLOBAL FUNCTION AND REGIONAL WALL WERE NORMAL  . CARDIOVERSION N/A 02/19/2017   Procedure: CARDIOVERSION;  Surgeon: Sanda Klein, MD;  Location: [facility];  Service: Cardiovascular;  Laterality: N/A;  . EP IMPLANTABLE DEVICE N/A 12/25/2016   Procedure: PPM Generator Changeout;  Surgeon: Deboraha Sprang, MD; complete heart block (MDT Adapta L)  . INSERT / REPLACE / REMOVE PACEMAKER  03/2007    Current Outpatient Medications  Medication Sig Dispense Refill  . apixaban (ELIQUIS) 5 MG TABS tablet Take 1 tablet (5 mg total) by mouth 2 (two) times daily. 180 tablet 1  . aspirin EC 81 MG tablet Take 1 tablet (81 mg total) by mouth daily.    Marland Kitchen atorvastatin (LIPITOR) 20 MG tablet Take 1 tablet (20 mg total) by mouth daily. 90 tablet 3  . calcipotriene (DOVONOX) 0.005 % ointment  Apply 1 application topically 2 (two) times daily.     . Cholecalciferol (VITAMIN D PO) Take 2,000 Units by mouth daily.     . DiphenhydrAMINE HCl, Sleep, (SIMPLY SLEEP PO) Take 1-2 tablets by mouth at bedtime as needed. For sleep    . fish oil-omega-3 fatty acids 1000 MG capsule Take 1 g by mouth daily.     . metoprolol tartrate (LOPRESSOR) 25 MG tablet Take 1 tablet (25 mg total) by mouth 2 (two) times daily. 60 tablet 11  . ranitidine (ZANTAC) 150 MG tablet Take 150 mg by mouth at bedtime as needed. For acid reflux    . timolol (TIMOPTIC) 0.5 % ophthalmic solution Place 1 drop into the right eye 2 (two) times daily.      No current facility-administered medications for this visit.     Allergies  Allergen Reactions  . Clindamycin/Lincomycin Itching    Unknown reaction  . Morphine Itching    Unknown reaction      Review of Systems negative except from HPI and PMH  Physical Exam BP 138/79 (BP Location: Right Arm)   Pulse 67   Resp 14   Ht 5\' 10"  (1.778 m)   Wt 216 lb (98 kg)   BMI 30.99 kg/m  Well developed and well nourished in no acute distress HENT normal E scleral and icterus clear Neck Supple JVP flat; carotids brisk and full Clear to ausculation  Regular  rate and rhythm, no murmurs gallops or rub Soft with active bowel sounds No clubbing cyanosis  Edema Alert and oriented, grossly normal motor and sensory function Skin Warm and Dry   ECG  Sinus 67  16/19/46    Assessment and  Plan  Complete heart block   Atrial flutter  Aortic valve Bioprosthesis  Pacer medtronic on advisory recall   The patient's device is on advisory recall.  We have asked reviewed the physiology.  In the short-term his device has been reprogrammed DVI  We have reviewed the benefits and risks of generator replacement.  These include but are not limited to lead fracture and infection.  The patient understands, agrees and is willing to proceed.     Current medicines are reviewed at  length with the patient today .  The patient does not  have concerns regarding medicines.  We spent more than 50% of our >25 min visit in face to face counseling regarding the above

## 2018-02-21 NOTE — Patient Instructions (Addendum)
Medication Instructions:  Your physician recommends that you continue on your current medications as directed. Please refer to the Current Medication list given to you today.  Labwork: You had a CBC and BMP drawn today.  Testing/Procedures: Generator Change Out  Please arrive at the [facility] main entrance of [place] [facility] at:   February 28, 2018 at 1:30pm You may have a light breakfast before 8:30am the day of your procedure Hold your Eliquis the night before and the day of your procedure. You may take all your other normal medications on the day of your proceduer You will need someone to drive you home at discharge  Follow instructions for the surgical scrub   Follow-Up: Your physician recommends that you schedule a follow-up appointment in:  10-14 days with the device clinic for a wound check 91 days with Dr Caryl Comes  Any Other Special Instructions Will Be Listed Below (If Applicable).     If you need a refill on your cardiac medications before your next appointment, please call your pharmacy.

## 2018-02-21 NOTE — Progress Notes (Signed)
Patient Care Team: Hulan Fess, MD as PCP - General (Family Medicine)   HPI  Scott [name] is a 77 y.o. male Seen in follow-up for pacemaker implanted in the context of complete heart block.  Unfortunately, he received a Medtronic device is now on advisory recall with recommendations for device generator replacement was patients were device dependent.  He has a history of atrial arrhythmias for which he underwent catheter ablation for atrial flutter.  The patient denies chest pain, shortness of breath, nocturnal dyspnea, orthopnea or peripheral edema.  There have been no palpitations, lightheadedness or syncope.      Past Medical History:  Diagnosis Date  . Atherosclerosis   . Atypical atrial flutter ([place])   . Complete heart block (HCC)    s/p PPM following AVR  . Hyperlipidemia   . Hypertensive cardiovascular disease   . Kidney calculus   . Kidney stones   . Obesity   . Valvular heart disease     Past Surgical History:  Procedure Laterality Date  . AORTIC ROOT REPLACEMENT    . AORTIC VALVE REPLACEMENT    . CARDIAC CATHETERIZATION  03/28/2007   OVERALL CARDIAC SIZE AND SILHOUETTE WERE NORMAL. GLOBAL FUNCTION AND REGIONAL WALL WERE NORMAL  . CARDIOVERSION N/A 02/19/2017   Procedure: CARDIOVERSION;  Surgeon: Sanda Oriel Ojo, MD;  Location: [facility];  Service: Cardiovascular;  Laterality: N/A;  . EP IMPLANTABLE DEVICE N/A 12/25/2016   Procedure: PPM Generator Changeout;  Surgeon: Deboraha Sprang, MD; complete heart block (MDT Adapta L)  . INSERT / REPLACE / REMOVE PACEMAKER  03/2007    Current Outpatient Medications  Medication Sig Dispense Refill  . apixaban (ELIQUIS) 5 MG TABS tablet Take 1 tablet (5 mg total) by mouth 2 (two) times daily. 180 tablet 1  . aspirin EC 81 MG tablet Take 1 tablet (81 mg total) by mouth daily.    Marland Kitchen atorvastatin (LIPITOR) 20 MG tablet Take 1 tablet (20 mg total) by mouth daily. 90 tablet 3  . calcipotriene (DOVONOX) 0.005 % ointment  Apply 1 application topically 2 (two) times daily.     . Cholecalciferol (VITAMIN D PO) Take 2,000 Units by mouth daily.     . DiphenhydrAMINE HCl, Sleep, (SIMPLY SLEEP PO) Take 1-2 tablets by mouth at bedtime as needed. For sleep    . fish oil-omega-3 fatty acids 1000 MG capsule Take 1 g by mouth daily.     . metoprolol tartrate (LOPRESSOR) 25 MG tablet Take 1 tablet (25 mg total) by mouth 2 (two) times daily. 60 tablet 11  . ranitidine (ZANTAC) 150 MG tablet Take 150 mg by mouth at bedtime as needed. For acid reflux    . timolol (TIMOPTIC) 0.5 % ophthalmic solution Place 1 drop into the right eye 2 (two) times daily.      No current facility-administered medications for this visit.     Allergies  Allergen Reactions  . Clindamycin/Lincomycin Itching    Unknown reaction  . Morphine Itching    Unknown reaction      Review of Systems negative except from HPI and PMH  Physical Exam BP 138/79 (BP Location: Right Arm)   Pulse 67   Resp 14   Ht 5\' 10"  (1.778 m)   Wt 216 lb (98 kg)   BMI 30.99 kg/m  Well developed and well nourished in no acute distress HENT normal E scleral and icterus clear Neck Supple JVP flat; carotids brisk and full Clear to ausculation  Regular  rate and rhythm, no murmurs gallops or rub Soft with active bowel sounds No clubbing cyanosis  Edema Alert and oriented, grossly normal motor and sensory function Skin Warm and Dry   ECG  Sinus 67  16/19/46    Assessment and  Plan  Complete heart block   Atrial flutter  Aortic valve Bioprosthesis  Pacer medtronic on advisory recall   The patient's device is on advisory recall.  We have asked reviewed the physiology.  In the short-term his device has been reprogrammed DVI  We have reviewed the benefits and risks of generator replacement.  These include but are not limited to lead fracture and infection.  The patient understands, agrees and is willing to proceed.     Current medicines are reviewed at  length with the patient today .  The patient does not  have concerns regarding medicines.  We spent more than 50% of our >25 min visit in face to face counseling regarding the above

## 2018-02-24 LAB — CUP PACEART INCLINIC DEVICE CHECK
Battery Impedance: 111 Ohm
Brady Statistic AP VP Percent: 24 %
Brady Statistic AP VS Percent: 0 %
Brady Statistic AS VP Percent: 76 %
Date Time Interrogation Session: 20190304171514
Implantable Lead Implant Date: 20080423
Implantable Lead Location: 753859
Implantable Lead Model: 4092
Lead Channel Impedance Value: 914 Ohm
Lead Channel Pacing Threshold Amplitude: 0.75 V
Lead Channel Pacing Threshold Amplitude: 0.75 V
Lead Channel Pacing Threshold Pulse Width: 0.4 ms
Lead Channel Pacing Threshold Pulse Width: 0.4 ms
Lead Channel Setting Sensing Sensitivity: 2.8 mV
MDC IDC LEAD IMPLANT DT: 20080423
MDC IDC LEAD LOCATION: 753860
MDC IDC MSMT BATTERY REMAINING LONGEVITY: 141 mo
MDC IDC MSMT BATTERY VOLTAGE: 2.78 V
MDC IDC MSMT LEADCHNL RA IMPEDANCE VALUE: 520 Ohm
MDC IDC MSMT LEADCHNL RA SENSING INTR AMPL: 2 mV
MDC IDC MSMT LEADCHNL RV PACING THRESHOLD AMPLITUDE: 0.875 V
MDC IDC MSMT LEADCHNL RV PACING THRESHOLD PULSEWIDTH: 0.4 ms
MDC IDC PG IMPLANT DT: 20180105
MDC IDC SET LEADCHNL RA PACING AMPLITUDE: 2 V
MDC IDC SET LEADCHNL RV PACING AMPLITUDE: 2.5 V
MDC IDC SET LEADCHNL RV PACING PULSEWIDTH: 0.4 ms
MDC IDC STAT BRADY AS VS PERCENT: 0 %

## 2018-02-28 ENCOUNTER — Encounter (HOSPITAL_COMMUNITY)
Admission: RE | Disposition: A | Discharge status: Home / Self Care | Payer: Self-pay | Source: Ambulatory Visit | Attending: Internal Medicine

## 2018-02-28 ENCOUNTER — Ambulatory Visit (HOSPITAL_COMMUNITY)
Admission: RE | Admit: 2018-02-28 | Discharge: 2018-02-28 | Disposition: A | Discharge status: Home / Self Care | Payer: Medicare Other | Source: Ambulatory Visit | Attending: Internal Medicine | Admitting: Internal Medicine

## 2018-02-28 DIAGNOSIS — I484 Atypical atrial flutter: Secondary | ICD-10-CM | POA: Insufficient documentation

## 2018-02-28 DIAGNOSIS — Z79899 Other long term (current) drug therapy: Secondary | ICD-10-CM | POA: Diagnosis not present

## 2018-02-28 DIAGNOSIS — I442 Atrioventricular block, complete: Secondary | ICD-10-CM | POA: Insufficient documentation

## 2018-02-28 DIAGNOSIS — Z881 Allergy status to other antibiotic agents status: Secondary | ICD-10-CM | POA: Diagnosis not present

## 2018-02-28 DIAGNOSIS — Z7901 Long term (current) use of anticoagulants: Secondary | ICD-10-CM | POA: Insufficient documentation

## 2018-02-28 DIAGNOSIS — Z885 Allergy status to narcotic agent status: Secondary | ICD-10-CM | POA: Insufficient documentation

## 2018-02-28 DIAGNOSIS — I119 Hypertensive heart disease without heart failure: Secondary | ICD-10-CM | POA: Insufficient documentation

## 2018-02-28 DIAGNOSIS — Z45010 Encounter for checking and testing of cardiac pacemaker pulse generator [battery]: Secondary | ICD-10-CM | POA: Diagnosis not present

## 2018-02-28 DIAGNOSIS — Z7982 Long term (current) use of aspirin: Secondary | ICD-10-CM | POA: Diagnosis not present

## 2018-02-28 DIAGNOSIS — Z4502 Encounter for adjustment and management of automatic implantable cardiac defibrillator: Secondary | ICD-10-CM | POA: Diagnosis not present

## 2018-02-28 DIAGNOSIS — Z952 Presence of prosthetic heart valve: Secondary | ICD-10-CM | POA: Diagnosis not present

## 2018-02-28 DIAGNOSIS — Z950 Presence of cardiac pacemaker: Secondary | ICD-10-CM | POA: Diagnosis present

## 2018-02-28 DIAGNOSIS — E785 Hyperlipidemia, unspecified: Secondary | ICD-10-CM | POA: Insufficient documentation

## 2018-02-28 DIAGNOSIS — T82118A Breakdown (mechanical) of other cardiac electronic device, initial encounter: Secondary | ICD-10-CM

## 2018-02-28 HISTORY — PX: PPM GENERATOR CHANGEOUT: EP1233

## 2018-02-28 LAB — SURGICAL PCR SCREEN
MRSA, PCR: NEGATIVE
Staphylococcus aureus: NEGATIVE

## 2018-02-28 SURGERY — PPM GENERATOR CHANGEOUT
Anesthesia: LOCAL

## 2018-02-28 MED ORDER — MUPIROCIN 2 % EX OINT
1.0000 "application " | TOPICAL_OINTMENT | Freq: Once | CUTANEOUS | Status: AC
  Administered 2018-02-28: 1 via TOPICAL

## 2018-02-28 MED ORDER — SODIUM CHLORIDE 0.9 % IV SOLN
INTRAVENOUS | Status: DC
  Administered 2018-02-28: 15:00:00 via INTRAVENOUS

## 2018-02-28 MED ORDER — ACETAMINOPHEN 325 MG PO TABS: 325.0000 mg | ORAL_TABLET | ORAL | Status: DC | PRN

## 2018-02-28 MED ORDER — LIDOCAINE HCL (PF) 1 % IJ SOLN
INTRAMUSCULAR | Status: DC | PRN
  Administered 2018-02-28: 45 mL

## 2018-02-28 MED ORDER — FENTANYL CITRATE (PF) 100 MCG/2ML IJ SOLN
INTRAMUSCULAR | Status: AC
  Filled 2018-02-28: qty 2

## 2018-02-28 MED ORDER — SODIUM CHLORIDE 0.9 % IR SOLN
Status: AC
Start: 2018-02-28 — End: 2018-02-28
  Filled 2018-02-28: qty 2

## 2018-02-28 MED ORDER — LIDOCAINE HCL (PF) 1 % IJ SOLN
INTRAMUSCULAR | Status: AC
  Filled 2018-02-28: qty 60

## 2018-02-28 MED ORDER — MUPIROCIN 2 % EX OINT
TOPICAL_OINTMENT | CUTANEOUS | Status: AC
  Administered 2018-02-28: 1 via TOPICAL
  Filled 2018-02-28: qty 22

## 2018-02-28 MED ORDER — CEFAZOLIN SODIUM-DEXTROSE 2-4 GM/100ML-% IV SOLN
INTRAVENOUS | Status: AC
  Filled 2018-02-28: qty 100

## 2018-02-28 MED ORDER — SODIUM CHLORIDE 0.9 % IV SOLN: INTRAVENOUS | Status: DC

## 2018-02-28 MED ORDER — ONDANSETRON HCL 4 MG/2ML IJ SOLN: 4.0000 mg | Freq: Four times a day (QID) | INTRAMUSCULAR | Status: DC | PRN

## 2018-02-28 MED ORDER — SODIUM CHLORIDE 0.9 % IR SOLN
80.0000 mg | Status: AC
  Administered 2018-02-28: 80 mg

## 2018-02-28 MED ORDER — CEFAZOLIN SODIUM-DEXTROSE 2-4 GM/100ML-% IV SOLN
2.0000 g | INTRAVENOUS | Status: AC
  Administered 2018-02-28: 2 g via INTRAVENOUS

## 2018-02-28 MED ORDER — MIDAZOLAM HCL 5 MG/5ML IJ SOLN
INTRAMUSCULAR | Status: AC
  Filled 2018-02-28: qty 5

## 2018-02-28 SURGICAL SUPPLY — 8 items
CABLE SURGICAL S-101-97-12 (CABLE) ×1 IMPLANT
HEMOSTAT SURGICEL 2X4 FIBR (HEMOSTASIS) ×1 IMPLANT
IPG PACE AZUR XT DR MRI W1DR01 (Pacemaker) ×1 IMPLANT
PACE AZURE XT DR MRI W1DR01 (Pacemaker) ×2 IMPLANT
PAD DEFIB LIFELINK (PAD) ×1 IMPLANT
POUCH AIGIS-R ANTIBACT ICD (Mesh General) ×2 IMPLANT
POUCH AIGIS-R ANTIBACT ICD LRG (Mesh General) IMPLANT
TRAY PACEMAKER INSERTION (PACKS) ×1 IMPLANT

## 2018-02-28 NOTE — Discharge Instructions (Signed)
Pacemaker Battery Change, Care After This sheet gives you information about how to care for yourself after your procedure. Your health care provider may also give you more specific instructions. If you have problems or questions, contact your health care provider. What can I expect after the procedure? After your procedure, it is common to have:  Pain or soreness at the site where the pacemaker was inserted.  Swelling at the site where the pacemaker was inserted.  Follow these instructions at home: Incision care  Keep the incision clean and dry. ? Do not take baths, swim, or use a hot tub until your health care provider approved       [place]; LEAVE STERI-STRIPS ON; DO NOT GET SITE WET FOR 7-10 DAYS ? Pat the area dry with a clean towel. Do not rub the area. This may cause bleeding.  Follow instructions from your health care provider about how to take care of your incision. Make sure you: ? Wash your hands with soap and water before you change your bandage (dressing). If soap and water are not available, use hand sanitizer. ? Change your dressing as told by your health care provider. ? Leave stitches (sutures), skin glue, or adhesive strips in place. These skin closures may need to stay in place for 2 weeks or longer. If adhesive strip edges start to loosen and curl up, you may trim the loose edges. Do not remove adhesive strips completely unless your health care provider tells you to do that.  Check your incision area every day for signs of infection. Check for: ? More redness, swelling, or pain. ? More fluid or blood. ? Warmth. ? Pus or a bad smell. Activity  Do not lift anything that is heavier than 10 lb (4.5 kg) until your health care provider says it is okay to do so.  For the first 2 weeks, or as long as told by your health care provider: ? Avoid lifting your left arm higher than your shoulder. ? Be gentle when you move your arms over your head. It is  okay to raise your arm to comb your hair. ? Avoid strenuous exercise.  Ask your health care provider when it is okay to: ? Resume your normal activities. ? Return to work or school. ? Resume sexual activity. Eating and drinking  Eat a heart-healthy diet. This should include plenty of fresh fruits and vegetables, whole grains, low-fat dairy products, and lean protein like chicken and fish.  Limit alcohol intake to no more than 1 drink a day for non-pregnant women and 2 drinks a day for men. One drink equals 12 oz of beer, 5 oz of wine, or 1 oz of hard liquor.  Check ingredients and nutrition facts on packaged foods and beverages. Avoid the following types of food: ? Food that is high in salt (sodium). ? Food that is high in saturated fat, like full-fat dairy or red meat. ? Food that is high in trans fat, like fried food. ? Food and drinks that are high in sugar. Lifestyle  Do not use any products that contain nicotine or tobacco, such as cigarettes and e-cigarettes. If you need help quitting, ask your health care provider.  Take steps to manage and control your weight.  Get regular exercise. Aim for 150 minutes of moderate-intensity exercise (such as walking or yoga) or 75 minutes of vigorous exercise (such as running or swimming) each week.  Manage other health problems, such as diabetes or high blood  pressure. Ask your health care provider how you can manage these conditions. General instructions  Do not drive for 24 hours after your procedure if you were given a medicine to help you relax (sedative).  Take over-the-counter and prescription medicines only as told by your health care provider.  Avoid putting pressure on the area where the pacemaker was placed.  If you need an MRI after your pacemaker has been placed, be sure to tell the health care provider who orders the MRI that you have a pacemaker.  Avoid close and prolonged exposure to electrical devices that have strong  magnetic fields. These include: ? Cell phones. Avoid keeping them in a pocket near the pacemaker, and try using the ear opposite the pacemaker. ? MP3 players. ? Household appliances, like microwaves. ? Metal detectors. ? Electric generators. ? High-tension wires.  Keep all follow-up visits as directed by your health care provider. This is important. Contact a health care provider if:  You have pain at the incision site that is not relieved by over-the-counter or prescription medicines.  You have any of these around your incision site or coming from it: ? More redness, swelling, or pain. ? Fluid or blood. ? Warmth to the touch. ? Pus or a bad smell.  You have a fever.  You feel brief, occasional palpitations, light-headedness, or any symptoms that you think might be related to your heart. Get help right away if:  You experience chest pain that is different from the pain at the pacemaker site.  You develop a red streak that extends above or below the incision site.  You experience shortness of breath.  You have palpitations or an irregular heartbeat.  You have light-headedness that does not go away quickly.  You faint or have dizzy spells.  Your pulse suddenly drops or increases rapidly and does not return to normal.  You begin to gain weight and your legs and ankles swell. Summary  After your procedure, it is common to have pain, soreness, and some swelling where the pacemaker was inserted.  Make sure to keep your incision clean and dry. Follow instructions from your health care provider about how to take care of your incision.  Check your incision every day for signs of infection, such as more pain or swelling, pus or a bad smell, warmth, or leaking fluid and blood.  Avoid strenuous exercise and lifting your left arm higher than your shoulder for 2 weeks, or as long as told by your health care provider. This information is not intended to replace advice given to you by  your health care provider. Make sure you discuss any questions you have with your health care provider. Document Released: 09/27/2013 Document Revised: 10/29/2016 Document Reviewed: 10/29/2016 Elsevier Interactive Patient Education  2017 [employer].

## 2018-02-28 NOTE — Interval H&P Note (Signed)
History and Physical Interval Note:  02/28/2018 2:55 PM  Scott [name]  has presented today for surgery, with the diagnosis of Medtronic Recall  The various methods of treatment have been discussed with the patient and family. After consideration of risks, benefits and other options for treatment, the patient has consented to  Procedure(s): PPM GENERATOR CHANGEOUT (N/A) as a surgical intervention .  The patient's history has been reviewed, patient examined, no change in status, stable for surgery.  I have reviewed the patient's chart and labs.  Questions were answered to the patient's satisfaction.     [name]

## 2018-03-01 ENCOUNTER — Encounter (HOSPITAL_COMMUNITY): Payer: Self-pay | Admitting: Internal Medicine

## 2018-03-10 ENCOUNTER — Ambulatory Visit (INDEPENDENT_AMBULATORY_CARE_PROVIDER_SITE_OTHER): Payer: Medicare Other | Admitting: *Deleted

## 2018-03-10 DIAGNOSIS — I442 Atrioventricular block, complete: Secondary | ICD-10-CM

## 2018-03-10 DIAGNOSIS — Z950 Presence of cardiac pacemaker: Secondary | ICD-10-CM | POA: Diagnosis not present

## 2018-03-10 LAB — CUP PACEART INCLINIC DEVICE CHECK
Brady Statistic AP VS Percent: 0 %
Brady Statistic AS VP Percent: 72.5 %
Brady Statistic AS VS Percent: 0.02 %
Date Time Interrogation Session: 20190321162809
Implantable Lead Implant Date: 20080423
Implantable Lead Location: 753859
Implantable Lead Location: 753860
Lead Channel Impedance Value: 456 Ohm
Lead Channel Impedance Value: 627 Ohm
Lead Channel Pacing Threshold Amplitude: 0.75 V
Lead Channel Pacing Threshold Pulse Width: 0.4 ms
Lead Channel Sensing Intrinsic Amplitude: 2.125 mV
Lead Channel Setting Pacing Amplitude: 2 V
Lead Channel Setting Pacing Amplitude: 2.5 V
Lead Channel Setting Pacing Pulse Width: 0.4 ms
Lead Channel Setting Sensing Sensitivity: 4 mV
MDC IDC LEAD IMPLANT DT: 20080423
MDC IDC MSMT BATTERY REMAINING LONGEVITY: 142 mo
MDC IDC MSMT BATTERY VOLTAGE: 3.21 V
MDC IDC MSMT LEADCHNL RA IMPEDANCE VALUE: 323 Ohm
MDC IDC MSMT LEADCHNL RA IMPEDANCE VALUE: 551 Ohm
MDC IDC MSMT LEADCHNL RA PACING THRESHOLD AMPLITUDE: 1 V
MDC IDC MSMT LEADCHNL RA PACING THRESHOLD PULSEWIDTH: 0.4 ms
MDC IDC PG IMPLANT DT: 20190311
MDC IDC STAT BRADY AP VP PERCENT: 27.48 %
MDC IDC STAT BRADY RA PERCENT PACED: 27.45 %
MDC IDC STAT BRADY RV PERCENT PACED: 99.98 %

## 2018-03-10 NOTE — Progress Notes (Signed)
Wound check appointment. Steri-strips removed. Wound without redness, slight edema noted, diffuse borders, site soft to palpation. Incision edges approximated, wound well healed. Normal device function. Thresholds, sensing, and impedances consistent with implant measurements. Device programmed at chronic outputs. Histogram distribution appropriate for patient and level of activity. No mode switches or high ventricular rates noted, +Eliquis. Patient educated about wound care, arm mobility, lifting restrictions, and BlueSync app. ROV with SK on 06/02/18.

## 2018-03-22 DIAGNOSIS — L853 Xerosis cutis: Secondary | ICD-10-CM | POA: Diagnosis not present

## 2018-03-22 DIAGNOSIS — Z48817 Encounter for surgical aftercare following surgery on the skin and subcutaneous tissue: Secondary | ICD-10-CM | POA: Diagnosis not present

## 2018-03-24 DIAGNOSIS — J988 Other specified respiratory disorders: Secondary | ICD-10-CM | POA: Diagnosis not present

## 2018-03-24 DIAGNOSIS — J329 Chronic sinusitis, unspecified: Secondary | ICD-10-CM | POA: Diagnosis not present

## 2018-03-24 DIAGNOSIS — J9801 Acute bronchospasm: Secondary | ICD-10-CM | POA: Diagnosis not present

## 2018-03-25 ENCOUNTER — Other Ambulatory Visit: Payer: Self-pay | Admitting: Internal Medicine

## 2018-04-05 DIAGNOSIS — L853 Xerosis cutis: Secondary | ICD-10-CM | POA: Diagnosis not present

## 2018-04-05 DIAGNOSIS — Z48817 Encounter for surgical aftercare following surgery on the skin and subcutaneous tissue: Secondary | ICD-10-CM | POA: Diagnosis not present

## 2018-04-24 ENCOUNTER — Other Ambulatory Visit: Payer: Self-pay | Admitting: Internal Medicine

## 2018-04-24 DIAGNOSIS — I442 Atrioventricular block, complete: Secondary | ICD-10-CM

## 2018-04-25 NOTE — Telephone Encounter (Signed)
Eliquis 5mg  refill request received; pt is 77 yrs old, wt-98kg, Crea-1.07 on 02/21/18, last seen by Dr. Caryl Comes on 02/21/18; will send in refill to requested Pharmacy.

## 2018-06-01 DIAGNOSIS — L400 Psoriasis vulgaris: Secondary | ICD-10-CM | POA: Diagnosis not present

## 2018-06-01 DIAGNOSIS — L538 Other specified erythematous conditions: Secondary | ICD-10-CM | POA: Diagnosis not present

## 2018-06-01 DIAGNOSIS — R208 Other disturbances of skin sensation: Secondary | ICD-10-CM | POA: Diagnosis not present

## 2018-06-01 DIAGNOSIS — L820 Inflamed seborrheic keratosis: Secondary | ICD-10-CM | POA: Diagnosis not present

## 2018-06-01 DIAGNOSIS — L570 Actinic keratosis: Secondary | ICD-10-CM | POA: Diagnosis not present

## 2018-06-01 DIAGNOSIS — L298 Other pruritus: Secondary | ICD-10-CM | POA: Diagnosis not present

## 2018-06-01 DIAGNOSIS — L578 Other skin changes due to chronic exposure to nonionizing radiation: Secondary | ICD-10-CM | POA: Diagnosis not present

## 2018-06-01 DIAGNOSIS — L239 Allergic contact dermatitis, unspecified cause: Secondary | ICD-10-CM | POA: Diagnosis not present

## 2018-06-01 DIAGNOSIS — L814 Other melanin hyperpigmentation: Secondary | ICD-10-CM | POA: Diagnosis not present

## 2018-06-02 ENCOUNTER — Encounter: Payer: Medicare Other | Admitting: Internal Medicine

## 2018-06-03 DIAGNOSIS — R21 Rash and other nonspecific skin eruption: Secondary | ICD-10-CM | POA: Diagnosis not present

## 2018-06-03 DIAGNOSIS — L298 Other pruritus: Secondary | ICD-10-CM | POA: Diagnosis not present

## 2018-06-28 ENCOUNTER — Encounter: Payer: Self-pay | Admitting: Internal Medicine

## 2018-06-28 ENCOUNTER — Ambulatory Visit (INDEPENDENT_AMBULATORY_CARE_PROVIDER_SITE_OTHER): Payer: Medicare Other | Admitting: Internal Medicine

## 2018-06-28 VITALS — BP 128/66 | HR 67 | Ht 70.0 in | Wt 211.2 lb

## 2018-06-28 DIAGNOSIS — Z950 Presence of cardiac pacemaker: Secondary | ICD-10-CM | POA: Diagnosis not present

## 2018-06-28 DIAGNOSIS — I484 Atypical atrial flutter: Secondary | ICD-10-CM

## 2018-06-28 DIAGNOSIS — I442 Atrioventricular block, complete: Secondary | ICD-10-CM | POA: Diagnosis not present

## 2018-06-28 LAB — CUP PACEART INCLINIC DEVICE CHECK
Date Time Interrogation Session: 20190709172243
Implantable Lead Implant Date: 20080423
Implantable Lead Location: 753859
MDC IDC LEAD IMPLANT DT: 20080423
MDC IDC LEAD LOCATION: 753860
MDC IDC PG IMPLANT DT: 20190311

## 2018-06-28 MED ORDER — ASPIRIN EC 81 MG PO TBEC: 81.0000 mg | DELAYED_RELEASE_TABLET | Freq: Every day | ORAL | Status: AC

## 2018-06-28 NOTE — Progress Notes (Signed)
Patient Care Team: Hulan Fess, MD as PCP - General (Family Medicine)   HPI  Scott [name] is a 77 y.o. male Seen in follow-up for pacemaker implanted in the context of complete heart block.  Unfortunately, he received a Medtronic device is now on advisory recall with recommendations for device generator replacement was patients were device dependent.  This was done 319  He is s/p bioprosthetic AVR  He has a history of atrial flutter for which she underwent ablation 2015.  Reviewing his pacemaker interrogations there is been no significant atrial arrhythmia since that time.  The patient denies chest pain, shortness of breath, nocturnal dyspnea, orthopnea or peripheral edema.  There have been no palpitations, lightheadedness or syncope.    He has nuisance bleeding related to his anticoagulation he would like to stop it.    Past Medical History:  Diagnosis Date  . Atherosclerosis   . Atypical atrial flutter ([place])   . Complete heart block (HCC)    s/p PPM following AVR  . Hyperlipidemia   . Hypertensive cardiovascular disease   . Kidney calculus   . Kidney stones   . Obesity   . Valvular heart disease     Past Surgical History:  Procedure Laterality Date  . AORTIC ROOT REPLACEMENT    . AORTIC VALVE REPLACEMENT    . CARDIAC CATHETERIZATION  03/28/2007   OVERALL CARDIAC SIZE AND SILHOUETTE WERE NORMAL. GLOBAL FUNCTION AND REGIONAL WALL WERE NORMAL  . CARDIOVERSION N/A 02/19/2017   Procedure: CARDIOVERSION;  Surgeon: Sanda Raevyn Sokol, MD;  Location: [facility];  Service: Cardiovascular;  Laterality: N/A;  . EP IMPLANTABLE DEVICE N/A 12/25/2016   Procedure: PPM Generator Changeout;  Surgeon: Deboraha Sprang, MD; complete heart block (MDT Adapta L)  . INSERT / REPLACE / REMOVE PACEMAKER  03/2007  . PPM GENERATOR CHANGEOUT N/A 02/28/2018   Procedure: PPM GENERATOR CHANGEOUT;  Surgeon: Deboraha Sprang, MD;  Location: [facility];  Service: Cardiovascular;  Laterality:  N/A;    Current Outpatient Medications  Medication Sig Dispense Refill  . apixaban (ELIQUIS) 5 MG TABS tablet Take 5 mg by mouth daily.    Marland Kitchen atorvastatin (LIPITOR) 20 MG tablet TAKE 1 TABLET BY MOUTH ONCE DAILY 90 tablet 3  . calcipotriene (DOVONOX) 0.005 % ointment Apply 1 application topically 2 (two) times daily as needed (for rash).     . Cholecalciferol (VITAMIN D) 2000 units CAPS Take 2,000 Units by mouth daily.     . ciclopirox (LOPROX) 0.77 % cream Apply 1 application topically 2 (two) times daily as needed (for rash).    . clobetasol cream (TEMOVATE) 6.21 % Apply 1 application topically 2 (two) times daily as needed (for rash).    . fish oil-omega-3 fatty acids 1000 MG capsule Take 1 g by mouth daily.     . metoprolol tartrate (LOPRESSOR) 25 MG tablet Take 1 tablet (25 mg total) by mouth 2 (two) times daily. 60 tablet 11  . ranitidine (ZANTAC) 150 MG tablet Take 150 mg by mouth at bedtime.     . timolol (TIMOPTIC) 0.5 % ophthalmic solution Place 1 drop into the right eye 2 (two) times daily.      No current facility-administered medications for this visit.     Allergies  Allergen Reactions  . Clindamycin/Lincomycin Itching  . Morphine Itching      Review of Systems negative except from HPI and PMH  Physical Exam BP 128/66   Pulse 67  Ht 5\' 10"  (1.778 m)   Wt 211 lb 3.2 oz (95.8 kg)   SpO2 94%   BMI 30.30 kg/m  Well developed and nourished in no acute distress HENT normal Neck supple with JVP-flat Clear Regular rate and rhythm, no murmurs or gallops Abd-soft with active BS No Clubbing cyanosis edema Skin-warm and dry bruisning A & Oriented  Grossly normal sensory and motor function    ECG sinus at 67 Intervals 16/19/47 P-synchronous/ AV  pacing Mr. Bolger   Assessment and  Plan  Complete heart block   Atrial flutter  Aortic valve Bioprosthesis  Pacer medtronic  The patient's device was interrogated.  The information was reviewed. No changes were  made in the programming.     No interval atrial fibrillation.  He would like to stop his anticoagulation.  We have reviewed the unsettled nature of the data about the treatment of intermittent atrial arrhythmias and anticoagulation.  I told her that there are studies ongoing for people are randomizing patients to anticoagulation when they had her atrial episodes.  I think it is reasonable in the absence of documented atrial arrhythmia to discontinue his apixaban at this time.  We will resume baby aspirin.  We spent more than 50% of our >25 min visit in face to face counseling regarding the above

## 2018-06-28 NOTE — Patient Instructions (Signed)
Medication Instructions:  Your physician has recommended you make the following change in your medication:   1. Stop Eliquis  Labwork: None ordered.  Testing/Procedures: None ordered.  Follow-Up: Your physician wants you to follow-up in: 9 months with Dr Caryl Comes. You will receive a reminder letter in the mail two months in advance. If you don't receive a letter, please call our office to schedule the follow-up appointment.  Remote monitoring is used to monitor your Pacemaker of ICD from home. This monitoring reduces the number of office visits required to check your device to one time per year. It allows [place] to keep an eye on the functioning of your device to ensure it is working properly. You are scheduled for a device check from home on 09/27/2018. You may send your transmission at any time that day. If you have a wireless device, the transmission will be sent automatically. After your physician reviews your transmission, you will receive a postcard with your next transmission date.     Any Other Special Instructions Will Be Listed Below (If Applicable).     If you need a refill on your cardiac medications before your next appointment, please call your pharmacy.

## 2018-07-01 ENCOUNTER — Other Ambulatory Visit: Payer: Self-pay | Admitting: Internal Medicine

## 2018-07-05 DIAGNOSIS — L814 Other melanin hyperpigmentation: Secondary | ICD-10-CM | POA: Diagnosis not present

## 2018-07-05 DIAGNOSIS — D492 Neoplasm of unspecified behavior of bone, soft tissue, and skin: Secondary | ICD-10-CM | POA: Diagnosis not present

## 2018-07-05 DIAGNOSIS — D224 Melanocytic nevi of scalp and neck: Secondary | ICD-10-CM | POA: Diagnosis not present

## 2018-07-05 DIAGNOSIS — D225 Melanocytic nevi of trunk: Secondary | ICD-10-CM | POA: Diagnosis not present

## 2018-07-05 DIAGNOSIS — L578 Other skin changes due to chronic exposure to nonionizing radiation: Secondary | ICD-10-CM | POA: Diagnosis not present

## 2018-07-05 DIAGNOSIS — L570 Actinic keratosis: Secondary | ICD-10-CM | POA: Diagnosis not present

## 2018-07-14 DIAGNOSIS — H401113 Primary open-angle glaucoma, right eye, severe stage: Secondary | ICD-10-CM | POA: Diagnosis not present

## 2018-08-04 DIAGNOSIS — L570 Actinic keratosis: Secondary | ICD-10-CM | POA: Diagnosis not present

## 2018-08-04 DIAGNOSIS — L538 Other specified erythematous conditions: Secondary | ICD-10-CM | POA: Diagnosis not present

## 2018-08-04 DIAGNOSIS — D1801 Hemangioma of skin and subcutaneous tissue: Secondary | ICD-10-CM | POA: Diagnosis not present

## 2018-08-04 DIAGNOSIS — L814 Other melanin hyperpigmentation: Secondary | ICD-10-CM | POA: Diagnosis not present

## 2018-08-04 DIAGNOSIS — L820 Inflamed seborrheic keratosis: Secondary | ICD-10-CM | POA: Diagnosis not present

## 2018-08-04 DIAGNOSIS — L298 Other pruritus: Secondary | ICD-10-CM | POA: Diagnosis not present

## 2018-08-04 DIAGNOSIS — L578 Other skin changes due to chronic exposure to nonionizing radiation: Secondary | ICD-10-CM | POA: Diagnosis not present

## 2018-09-27 ENCOUNTER — Ambulatory Visit (INDEPENDENT_AMBULATORY_CARE_PROVIDER_SITE_OTHER): Payer: Medicare Other | Admitting: *Deleted

## 2018-09-27 DIAGNOSIS — I442 Atrioventricular block, complete: Secondary | ICD-10-CM

## 2018-09-27 DIAGNOSIS — I10 Essential (primary) hypertension: Secondary | ICD-10-CM

## 2018-09-28 NOTE — Progress Notes (Signed)
Remote pacemaker transmission.   

## 2018-10-05 ENCOUNTER — Encounter: Payer: Self-pay | Admitting: Cardiology

## 2018-10-20 LAB — CUP PACEART REMOTE DEVICE CHECK
Battery Remaining Longevity: 134 mo
Battery Voltage: 3.1 V
Brady Statistic AS VS Percent: 0.03 %
Brady Statistic RA Percent Paced: 44.5 %
Date Time Interrogation Session: 20191008013708
Implantable Lead Implant Date: 20080423
Implantable Lead Location: 753860
Implantable Lead Model: 4092
Implantable Lead Model: 5076
Implantable Pulse Generator Implant Date: 20190311
Lead Channel Impedance Value: 342 Ohm
Lead Channel Impedance Value: 684 Ohm
Lead Channel Pacing Threshold Amplitude: 0.875 V
Lead Channel Pacing Threshold Pulse Width: 0.4 ms
Lead Channel Sensing Intrinsic Amplitude: 1.75 mV
Lead Channel Sensing Intrinsic Amplitude: 1.75 mV
Lead Channel Setting Pacing Amplitude: 2.5 V
Lead Channel Setting Pacing Pulse Width: 0.4 ms
Lead Channel Setting Sensing Sensitivity: 4 mV
MDC IDC LEAD IMPLANT DT: 20080423
MDC IDC LEAD LOCATION: 753859
MDC IDC MSMT LEADCHNL RA IMPEDANCE VALUE: 475 Ohm
MDC IDC MSMT LEADCHNL RA PACING THRESHOLD AMPLITUDE: 1 V
MDC IDC MSMT LEADCHNL RA PACING THRESHOLD PULSEWIDTH: 0.4 ms
MDC IDC MSMT LEADCHNL RV IMPEDANCE VALUE: 513 Ohm
MDC IDC SET LEADCHNL RA PACING AMPLITUDE: 2 V
MDC IDC STAT BRADY AP VP PERCENT: 44.52 %
MDC IDC STAT BRADY AP VS PERCENT: 0 %
MDC IDC STAT BRADY AS VP PERCENT: 55.45 %
MDC IDC STAT BRADY RV PERCENT PACED: 99.97 %

## 2018-12-27 ENCOUNTER — Ambulatory Visit (INDEPENDENT_AMBULATORY_CARE_PROVIDER_SITE_OTHER): Payer: Medicare Other

## 2018-12-27 DIAGNOSIS — I495 Sick sinus syndrome: Secondary | ICD-10-CM

## 2018-12-27 DIAGNOSIS — I442 Atrioventricular block, complete: Secondary | ICD-10-CM

## 2018-12-28 LAB — CUP PACEART REMOTE DEVICE CHECK
Battery Remaining Longevity: 131 mo
Battery Voltage: 3.05 V
Brady Statistic AP VP Percent: 37.85 %
Brady Statistic AS VP Percent: 62.13 %
Brady Statistic AS VS Percent: 0.01 %
Brady Statistic RA Percent Paced: 37.82 %
Brady Statistic RV Percent Paced: 99.98 %
Implantable Lead Implant Date: 20080423
Implantable Lead Implant Date: 20080423
Implantable Lead Location: 753860
Implantable Lead Model: 4092
Implantable Lead Model: 5076
Lead Channel Impedance Value: 342 Ohm
Lead Channel Impedance Value: 475 Ohm
Lead Channel Impedance Value: 551 Ohm
Lead Channel Impedance Value: 646 Ohm
Lead Channel Pacing Threshold Amplitude: 0.875 V
Lead Channel Pacing Threshold Amplitude: 1 V
Lead Channel Pacing Threshold Pulse Width: 0.4 ms
Lead Channel Sensing Intrinsic Amplitude: 1.625 mV
Lead Channel Sensing Intrinsic Amplitude: 8.875 mV
Lead Channel Setting Pacing Amplitude: 2 V
Lead Channel Setting Pacing Pulse Width: 0.4 ms
Lead Channel Setting Sensing Sensitivity: 4 mV
MDC IDC LEAD LOCATION: 753859
MDC IDC MSMT LEADCHNL RA PACING THRESHOLD PULSEWIDTH: 0.4 ms
MDC IDC MSMT LEADCHNL RA SENSING INTR AMPL: 1.625 mV
MDC IDC MSMT LEADCHNL RV SENSING INTR AMPL: 8.875 mV
MDC IDC PG IMPLANT DT: 20190311
MDC IDC SESS DTM: 20200107180142
MDC IDC SET LEADCHNL RV PACING AMPLITUDE: 2.5 V
MDC IDC STAT BRADY AP VS PERCENT: 0 %

## 2018-12-29 NOTE — Progress Notes (Signed)
Remote pacemaker transmission.   

## 2019-03-14 ENCOUNTER — Other Ambulatory Visit: Payer: Self-pay | Admitting: Internal Medicine

## 2019-03-15 ENCOUNTER — Other Ambulatory Visit: Payer: Self-pay | Admitting: *Deleted

## 2019-03-28 ENCOUNTER — Ambulatory Visit (INDEPENDENT_AMBULATORY_CARE_PROVIDER_SITE_OTHER): Payer: Medicare Other | Admitting: *Deleted

## 2019-03-28 ENCOUNTER — Telehealth: Payer: Self-pay

## 2019-03-28 ENCOUNTER — Other Ambulatory Visit: Payer: Self-pay

## 2019-03-28 DIAGNOSIS — I442 Atrioventricular block, complete: Secondary | ICD-10-CM

## 2019-03-28 NOTE — Telephone Encounter (Signed)
Unable to speak  with patient to remind of missed remote transmission 

## 2019-03-29 LAB — CUP PACEART REMOTE DEVICE CHECK
Battery Remaining Longevity: 128 mo
Battery Voltage: 3.03 V
Brady Statistic AP VP Percent: 34.41 %
Brady Statistic AP VS Percent: 0 %
Brady Statistic AS VP Percent: 65.57 %
Brady Statistic AS VS Percent: 0.01 %
Brady Statistic RA Percent Paced: 34.38 %
Brady Statistic RV Percent Paced: 99.98 %
Date Time Interrogation Session: 20200408181544
Implantable Lead Implant Date: 20080423
Implantable Lead Implant Date: 20080423
Implantable Lead Location: 753859
Implantable Lead Location: 753860
Implantable Lead Model: 4092
Implantable Lead Model: 5076
Implantable Pulse Generator Implant Date: 20190311
Lead Channel Impedance Value: 342 Ohm
Lead Channel Impedance Value: 494 Ohm
Lead Channel Impedance Value: 513 Ohm
Lead Channel Impedance Value: 665 Ohm
Lead Channel Pacing Threshold Amplitude: 0.75 V
Lead Channel Pacing Threshold Amplitude: 1 V
Lead Channel Pacing Threshold Pulse Width: 0.4 ms
Lead Channel Pacing Threshold Pulse Width: 0.4 ms
Lead Channel Sensing Intrinsic Amplitude: 1.875 mV
Lead Channel Sensing Intrinsic Amplitude: 1.875 mV
Lead Channel Sensing Intrinsic Amplitude: 8.875 mV
Lead Channel Sensing Intrinsic Amplitude: 8.875 mV
Lead Channel Setting Pacing Amplitude: 2 V
Lead Channel Setting Pacing Amplitude: 2.5 V
Lead Channel Setting Pacing Pulse Width: 0.4 ms
Lead Channel Setting Sensing Sensitivity: 4 mV

## 2019-04-05 ENCOUNTER — Encounter: Payer: Self-pay | Admitting: Cardiology

## 2019-04-05 NOTE — Progress Notes (Signed)
Remote pacemaker transmission.   

## 2019-04-07 ENCOUNTER — Telehealth: Payer: Self-pay

## 2019-04-07 NOTE — Telephone Encounter (Signed)
Virtual Visit Pre-Appointment Phone Call  Steps For Call:  1. Confirm consent - "In the setting of the current Covid19 crisis, you are scheduled for a (phone or video) visit with your provider on (date) at (time).  Just as we do with many in-office visits, in order for you to participate in this visit, we must obtain consent.  If you'd like, I can send this to your mychart (if signed up) or email for you to review.  Otherwise, I can obtain your verbal consent now.  All virtual visits are billed to your insurance company just like a normal visit would be.  By agreeing to a virtual visit, we'd like you to understand that the technology does not allow for your provider to perform an examination, and thus may limit your provider's ability to fully assess your condition.  Finally, though the technology is pretty good, we cannot assure that it will always work on either your or our end, and in the setting of a video visit, we may have to convert it to a phone-only visit.  In either situation, we cannot ensure that we have a secure connection.  Are you willing to proceed?" STAFF: Did the patient verbally acknowledge consent to telehealth visit? Document YES/NO here: Yes  2. Confirm the BEST phone number to call the day of the visit by including in appointment notes  3. Give patient instructions for WebEx/MyChart download to smartphone as below or Doximity/Doxy.me if video visit (depending on what platform provider is using)  4. Advise patient to be prepared with their blood pressure, heart rate, weight, any heart rhythm information, their current medicines, and a piece of paper and pen handy for any instructions they may receive the day of their visit  5. Inform patient they will receive a phone call 15 minutes prior to their appointment time (may be from unknown caller ID) so they should be prepared to answer  6. Confirm that appointment type is correct in Epic appointment notes (VIDEO vs PHONE)      TELEPHONE CALL NOTE  Scott [name] has been deemed a candidate for a follow-up tele-health visit to limit community exposure during the Covid-19 pandemic. I spoke with the patient via phone to ensure availability of phone/video source, confirm preferred email & phone number, and discuss instructions and expectations.  I reminded Scott [name] to be prepared with any vital sign and/or heart rhythm information that could potentially be obtained via home monitoring, at the time of his visit. I reminded Scott [name] to expect a phone call at the time of his visit if his visit.  Mady Haagensen, [place] 04/07/2019 9:59 AM   INSTRUCTIONS FOR DOWNLOADING THE WEBEX APP TO SMARTPHONE  - If Apple, ask patient to go to App Store and type in WebEx in the search bar. [place] [employer], the blue/green circle. If Android, go to [employer] and type in [employer] in the search bar. The app is free but as with any other app downloads, their phone may require them to verify saved payment information or Apple/Android password.  - The patient does NOT have to create an account. - On the day of the visit, the assist will walk the patient through joining the meeting with the meeting number/password.  INSTRUCTIONS FOR DOWNLOADING THE MYCHART APP TO SMARTPHONE  - The patient must first make sure to have activated MyChart and know their login information - If Apple, go to [employer] and type  in [place] in the search bar and download the app. If Android, ask patient to go to [employer] and type in [place] in the search bar and download the app. The app is free but as with any other app downloads, their phone may require them to verify saved payment information or Apple/Android password.  - The patient will need to then log into the app with their MyChart username and password, and select [facility] as their healthcare provider to link the account. When it is time for your visit, go to the MyChart app,  find appointments, and click Begin Video Visit. Be sure to Select Allow for your device to access the Microphone and Camera for your visit. You will then be connected, and your provider will be with you shortly.  **If they have any issues connecting, or need assistance please contact MyChart service desk [phone])**  **If using a computer, in order to ensure the best quality for their visit they will need to use either of the following Internet Browsers: [employer], or Google Chrome**  IF USING DOXIMITY or DOXY.ME - The patient will receive a link just prior to their visit, either by text or email (to be determined day of appointment depending on if it's doxy.me or Doximity).     FULL LENGTH CONSENT FOR TELE-HEALTH VISIT   I hereby voluntarily request, consent and authorize [facility] and its employed or contracted physicians, physician assistants, nurse practitioners or other licensed health care professionals (the Practitioner), to provide me with telemedicine health care services (the "Services") as deemed necessary by the treating Practitioner. I acknowledge and consent to receive the Services by the Practitioner via telemedicine. I understand that the telemedicine visit will involve communicating with the Practitioner through live audiovisual communication technology and the disclosure of certain medical information by electronic transmission. I acknowledge that I have been given the opportunity to request an in-person assessment or other available alternative prior to the telemedicine visit and am voluntarily participating in the telemedicine visit.  I understand that I have the right to withhold or withdraw my consent to the use of telemedicine in the course of my care at any time, without affecting my right to future care or treatment, and that the Practitioner or I may terminate the telemedicine visit at any time. I understand that I have the right to inspect all  information obtained and/or recorded in the course of the telemedicine visit and may receive copies of available information for a reasonable fee.  I understand that some of the potential risks of receiving the Services via telemedicine include:  Marland Kitchen Delay or interruption in medical evaluation due to technological equipment failure or disruption; . Information transmitted may not be sufficient (e.g. poor resolution of images) to allow for appropriate medical decision making by the Practitioner; and/or  . In rare instances, security protocols could fail, causing a breach of personal health information.  Furthermore, I acknowledge that it is my responsibility to provide information about my medical history, conditions and care that is complete and accurate to the best of my ability. I acknowledge that Practitioner's advice, recommendations, and/or decision may be based on factors not within their control, such as incomplete or inaccurate data provided by me or distortions of diagnostic images or specimens that may result from electronic transmissions. I understand that the practice of medicine is not an exact science and that Practitioner makes no warranties or guarantees regarding treatment outcomes. I acknowledge that I will receive a  copy of this consent concurrently upon execution via email to the email address I last provided but may also request a printed copy by calling the office of [facility].    I understand that my insurance will be billed for this visit.   I have read or had this consent read to me. . I understand the contents of this consent, which adequately explains the benefits and risks of the Services being provided via telemedicine.  . I have been provided ample opportunity to ask questions regarding this consent and the Services and have had my questions answered to my satisfaction. . I give my informed consent for the services to be provided through the use of telemedicine in my  medical care  By participating in this telemedicine visit I agree to the above.

## 2019-04-10 ENCOUNTER — Other Ambulatory Visit: Payer: Self-pay

## 2019-04-10 ENCOUNTER — Telehealth (INDEPENDENT_AMBULATORY_CARE_PROVIDER_SITE_OTHER): Payer: Medicare Other | Admitting: Internal Medicine

## 2019-04-10 DIAGNOSIS — I10 Essential (primary) hypertension: Secondary | ICD-10-CM

## 2019-04-10 DIAGNOSIS — Z950 Presence of cardiac pacemaker: Secondary | ICD-10-CM | POA: Diagnosis not present

## 2019-04-10 DIAGNOSIS — I495 Sick sinus syndrome: Secondary | ICD-10-CM

## 2019-04-10 DIAGNOSIS — I442 Atrioventricular block, complete: Secondary | ICD-10-CM

## 2019-04-10 NOTE — Progress Notes (Signed)
Electrophysiology TeleHealth Note   Due to national recommendations of social distancing due to COVID 19, an audio/video telehealth visit is felt to be most appropriate for this patient at this time.  See MyChart message from today for the patient's consent to telehealth for [facility].   Date:  04/10/2019   ID:  Scott [name], DOB 12-31-1940, MRN [id number]  Location: patient's home  Provider location: [address] [place]  Evaluation Performed: Follow-up visit  PCP:  Hulan Fess, MD  Cardiologist:     Electrophysiologist:  SK   Chief Complaint:  Pacemaker followup and AVR  History of Present Illness:    Scott [name] is a 78 y.o. male who presents via audio/video conferencing for a telehealth visit today.  Since last being seen in our clinic for Complete heart block with a previously implanted Medtronic pacemaker complicated by advisory recall and generator replacement 3/19, the patient reports  Doing well  The patient denies chest pain, shortness of breath, nocturnal dyspnea, orthopnea or peripheral edema.  There have been no palpitations, lightheadedness or syncope.  Mild bendopnea  Exercising 3 days a week;  He is s/p bioprosthetic AVR  Last echo 2015 with LVEF 50-55%  BP 140;'s as outpt  History of atrial flutter s/p ablation 2015.    Date Cr K Hgb  3/19 1.07 4.4 14.1          The patient denies symptoms of fevers, chills, cough, or new SOB worrisome for COVID 19. *   Past Medical History:  Diagnosis Date  . Atherosclerosis   . Atypical atrial flutter ([place])   . Complete heart block (HCC)    s/p PPM following AVR  . Hyperlipidemia   . Hypertensive cardiovascular disease   . Kidney calculus   . Kidney stones   . Obesity   . Valvular heart disease     Past Surgical History:  Procedure Laterality Date  . AORTIC ROOT REPLACEMENT    . AORTIC VALVE REPLACEMENT    . CARDIAC CATHETERIZATION  03/28/2007   OVERALL CARDIAC SIZE AND  SILHOUETTE WERE NORMAL. GLOBAL FUNCTION AND REGIONAL WALL WERE NORMAL  . CARDIOVERSION N/A 02/19/2017   Procedure: CARDIOVERSION;  Surgeon: Sanda Klein, MD;  Location: [facility];  Service: Cardiovascular;  Laterality: N/A;  . EP IMPLANTABLE DEVICE N/A 12/25/2016   Procedure: PPM Generator Changeout;  Surgeon: Deboraha Sprang, MD; complete heart block (MDT Adapta L)  . INSERT / REPLACE / REMOVE PACEMAKER  03/2007  . PPM GENERATOR CHANGEOUT N/A 02/28/2018   Procedure: PPM GENERATOR CHANGEOUT;  Surgeon: Deboraha Sprang, MD;  Location: [facility];  Service: Cardiovascular;  Laterality: N/A;    Current Outpatient Medications  Medication Sig Dispense Refill  . aspirin EC 81 MG tablet Take 1 tablet (81 mg total) by mouth daily.    Marland Kitchen atorvastatin (LIPITOR) 20 MG tablet Take 1 tablet by mouth once daily 90 tablet 0  . calcipotriene (DOVONOX) 0.005 % ointment Apply 1 application topically 2 (two) times daily as needed (for rash).     . Cholecalciferol (VITAMIN D) 2000 units CAPS Take 2,000 Units by mouth daily.     . clobetasol cream (TEMOVATE) 5.99 % Apply 1 application topically 2 (two) times daily as needed (for rash).    . fish oil-omega-3 fatty acids 1000 MG capsule Take 1 g by mouth daily.     . metoprolol tartrate (LOPRESSOR) 25 MG tablet TAKE 1 TABLET BY MOUTH TWICE DAILY  180 tablet 3  . ranitidine (ZANTAC) 150 MG tablet Take 150 mg by mouth at bedtime.     . timolol (TIMOPTIC) 0.5 % ophthalmic solution Place 1 drop into the right eye 2 (two) times daily.      No current facility-administered medications for this visit.     Allergies:   Clindamycin/lincomycin and Morphine   Social History:  The patient  reports that he quit smoking about 34 years ago. His smoking use included cigarettes. He has a 45.00 pack-year smoking history. He has never used smokeless tobacco. He reports that he does not drink alcohol or use drugs.   Family History:  The patient's   family history includes  Heart attack in his father.   ROS:  Please see the history of present illness.   All other systems are personally reviewed and negative.    Exam:    Vital Signs:  There were no vitals taken for this visit.   Well appearing, alert and conversant, regular work of breathing,  good skin color Eyes- anicteric, neuro- grossly intact, skin- no apparent rash or lesions or cyanosis, mouth- oral mucosa is pink   Labs/Other Tests and Data Reviewed:    Recent Labs: No results found for requested labs within last 8760 hours.   Wt Readings from Last 3 Encounters:  06/28/18 211 lb 3.2 oz (95.8 kg)  02/28/18 210 lb (95.3 kg)  02/21/18 216 lb (98 kg)     Other studies personally reviewed: Additional studies/ records that were reviewed today include: *As above    Device interrogation ( remote) was reviewed from 4/20 Normal device funciton NO atrial arrhythmias, VT NS  ASSESSMENT & PLAN:   Complete heart block   Atrial flutter s/p ablation 2015   Aortic valve Bioprosthesis  VT nonsustained   Cardiomyopathy  Mild nonischemic   Pacer medtronic on advisory recall  S/p generator replacement 3/19-- original implantation 2008  No interval atrial arrhythmias.  Nonsustained VT noted; new, and without symptoms.  Mechanism is not clear.  Will need to check electrolytes.  Also will reevaluate left ventricular function given now long-term RV apical pacing >10 yrs   >>  echocardiogram  Mild bendopnea; encourage more exercise and decreased eating   Pacemaker function is normal     COVID 19 screen The patient denies symptoms of COVID 19 at this time.  The importance of social distancing was discussed today.  Follow-up:  12 m    Current medicines are reviewed at length with the patient today.   The patient does not have concerns regarding his medicines.  The following changes were made today:  none  Labs/ tests ordered today include:none No orders of the defined types were placed in  this encounter.   Future tests ( post COVID )  BMET and Echo in 2  months  Patient Risk:  after full review of this patients clinical status, I feel that they are at moderate risk at this time.  Today, I have spent 10 minutes with the patient with telehealth technology discussing the above.  Signed, [name], MD  04/10/2019 10:47 AM     [place]  [address] [phone] (office) [phone] (fax)

## 2019-04-18 ENCOUNTER — Telehealth: Payer: Self-pay | Admitting: Internal Medicine

## 2019-04-18 NOTE — Telephone Encounter (Signed)
New message   Patient is having issues with his medtronic device transmission. Please call the patient.

## 2019-04-18 NOTE — Telephone Encounter (Signed)
The pt had the Medtronic HeartCare app on his phone to do transmissions. However, when he called medtronic they had him to [place] the app and get a new app. He can not get the new app to download onto the phone. Pamala Hurry is better with the apps than me. I told the pt she will give him a call back and try to help him. I informed the pt if she can not help him than he may have to order a new monitor that is easier to use.

## 2019-04-18 NOTE — Telephone Encounter (Signed)
Spoke w/ pt and instructed how to re install the app. Re installing of the app was successful but pt had a hard time signing in. I informed him that I did not have access to his username / password. Instructed him to call tech support for further help trouble shooting the monitor.

## 2019-04-21 NOTE — Telephone Encounter (Signed)
Called pt back and he informed me that he got his monitor updated and working properly.

## 2019-05-29 ENCOUNTER — Encounter: Payer: Self-pay | Admitting: Internal Medicine

## 2019-05-29 ENCOUNTER — Telehealth: Payer: Self-pay | Admitting: Internal Medicine

## 2019-05-29 NOTE — Telephone Encounter (Signed)
New message   Patient states that he had blood work done by [employer] at [facility]. Patient states that this blood work has been within the last 30 days. He would like to have this blood work reviewed so that he does not have to have blood work on Wednesday. Please advise.

## 2019-05-29 NOTE — Telephone Encounter (Signed)
error 

## 2019-05-30 ENCOUNTER — Telehealth (HOSPITAL_COMMUNITY): Payer: Self-pay

## 2019-05-30 NOTE — Telephone Encounter (Signed)
Pt has recent lab work on file for Dr Caryl Comes to review. Lab appt has been canceled.

## 2019-05-30 NOTE — Telephone Encounter (Signed)

## 2019-05-31 ENCOUNTER — Other Ambulatory Visit: Payer: Self-pay

## 2019-05-31 ENCOUNTER — Other Ambulatory Visit: Payer: Medicare Other

## 2019-05-31 ENCOUNTER — Ambulatory Visit (HOSPITAL_COMMUNITY): Payer: Medicare Other | Attending: Cardiology

## 2019-05-31 DIAGNOSIS — I495 Sick sinus syndrome: Secondary | ICD-10-CM | POA: Diagnosis present

## 2019-05-31 DIAGNOSIS — Z950 Presence of cardiac pacemaker: Secondary | ICD-10-CM | POA: Diagnosis present

## 2019-05-31 DIAGNOSIS — I442 Atrioventricular block, complete: Secondary | ICD-10-CM

## 2019-05-31 DIAGNOSIS — I10 Essential (primary) hypertension: Secondary | ICD-10-CM | POA: Insufficient documentation

## 2019-05-31 MED ORDER — PERFLUTREN LIPID MICROSPHERE
1.0000 mL | INTRAVENOUS | Status: AC | PRN
  Administered 2019-05-31: 1 mL via INTRAVENOUS

## 2019-06-26 ENCOUNTER — Other Ambulatory Visit: Payer: Self-pay | Admitting: Internal Medicine

## 2019-06-27 ENCOUNTER — Ambulatory Visit (INDEPENDENT_AMBULATORY_CARE_PROVIDER_SITE_OTHER): Payer: Medicare Other | Admitting: *Deleted

## 2019-06-27 DIAGNOSIS — I495 Sick sinus syndrome: Secondary | ICD-10-CM

## 2019-06-28 ENCOUNTER — Ambulatory Visit (INDEPENDENT_AMBULATORY_CARE_PROVIDER_SITE_OTHER): Payer: Medicare Other | Admitting: Internal Medicine

## 2019-06-28 ENCOUNTER — Encounter: Payer: Self-pay | Admitting: Internal Medicine

## 2019-06-28 ENCOUNTER — Other Ambulatory Visit: Payer: Self-pay

## 2019-06-28 ENCOUNTER — Telehealth: Payer: Self-pay | Admitting: Internal Medicine

## 2019-06-28 VITALS — BP 118/68 | HR 72 | Ht 70.0 in | Wt 219.2 lb

## 2019-06-28 DIAGNOSIS — I495 Sick sinus syndrome: Secondary | ICD-10-CM

## 2019-06-28 DIAGNOSIS — I442 Atrioventricular block, complete: Secondary | ICD-10-CM | POA: Diagnosis not present

## 2019-06-28 DIAGNOSIS — Z950 Presence of cardiac pacemaker: Secondary | ICD-10-CM

## 2019-06-28 DIAGNOSIS — I10 Essential (primary) hypertension: Secondary | ICD-10-CM

## 2019-06-28 LAB — CUP PACEART INCLINIC DEVICE CHECK
Battery Remaining Longevity: 128 mo
Battery Voltage: 3.02 V
Brady Statistic AP VP Percent: 36.44 %
Brady Statistic AP VS Percent: 0 %
Brady Statistic AS VP Percent: 63.5 %
Brady Statistic AS VS Percent: 0.06 %
Brady Statistic RA Percent Paced: 36.44 %
Brady Statistic RV Percent Paced: 99.94 %
Date Time Interrogation Session: 20200708120715
Implantable Lead Implant Date: 20080423
Implantable Lead Implant Date: 20080423
Implantable Lead Location: 753859
Implantable Lead Location: 753860
Implantable Lead Model: 4092
Implantable Lead Model: 5076
Implantable Pulse Generator Implant Date: 20190311
Lead Channel Impedance Value: 361 Ohm
Lead Channel Impedance Value: 532 Ohm
Lead Channel Impedance Value: 570 Ohm
Lead Channel Impedance Value: 741 Ohm
Lead Channel Pacing Threshold Amplitude: 0.875 V
Lead Channel Pacing Threshold Amplitude: 1 V
Lead Channel Pacing Threshold Pulse Width: 0.4 ms
Lead Channel Pacing Threshold Pulse Width: 0.4 ms
Lead Channel Sensing Intrinsic Amplitude: 1.5 mV
Lead Channel Sensing Intrinsic Amplitude: 1.75 mV
Lead Channel Sensing Intrinsic Amplitude: 8.875 mV
Lead Channel Sensing Intrinsic Amplitude: 8.875 mV
Lead Channel Setting Pacing Amplitude: 2 V
Lead Channel Setting Pacing Amplitude: 2.5 V
Lead Channel Setting Pacing Pulse Width: 0.4 ms
Lead Channel Setting Sensing Sensitivity: 4 mV

## 2019-06-28 LAB — CUP PACEART REMOTE DEVICE CHECK
Battery Remaining Longevity: 128 mo
Battery Voltage: 3.02 V
Brady Statistic AP VP Percent: 29.05 %
Brady Statistic AP VS Percent: 0.01 %
Brady Statistic AS VP Percent: 70.76 %
Brady Statistic AS VS Percent: 0.18 %
Brady Statistic RA Percent Paced: 29.12 %
Brady Statistic RV Percent Paced: 99.81 %
Date Time Interrogation Session: 20200708164431
Implantable Lead Implant Date: 20080423
Implantable Lead Implant Date: 20080423
Implantable Lead Location: 753859
Implantable Lead Location: 753860
Implantable Lead Model: 4092
Implantable Lead Model: 5076
Implantable Pulse Generator Implant Date: 20190311
Lead Channel Impedance Value: 361 Ohm
Lead Channel Impedance Value: 532 Ohm
Lead Channel Impedance Value: 570 Ohm
Lead Channel Impedance Value: 741 Ohm
Lead Channel Pacing Threshold Amplitude: 0.875 V
Lead Channel Pacing Threshold Amplitude: 1 V
Lead Channel Pacing Threshold Pulse Width: 0.4 ms
Lead Channel Pacing Threshold Pulse Width: 0.4 ms
Lead Channel Sensing Intrinsic Amplitude: 1.5 mV
Lead Channel Sensing Intrinsic Amplitude: 1.75 mV
Lead Channel Sensing Intrinsic Amplitude: 8.875 mV
Lead Channel Sensing Intrinsic Amplitude: 8.875 mV
Lead Channel Setting Pacing Amplitude: 2 V
Lead Channel Setting Pacing Amplitude: 2.5 V
Lead Channel Setting Pacing Pulse Width: 0.4 ms
Lead Channel Setting Sensing Sensitivity: 4 mV

## 2019-06-28 MED ORDER — FUROSEMIDE 20 MG PO TABS: 20.0000 mg | ORAL_TABLET | ORAL | 0 refills | Status: AC | PRN

## 2019-06-28 NOTE — Telephone Encounter (Signed)
New Message ° ° ° °Left message to confirm appt and answer covid questions  °

## 2019-06-28 NOTE — Progress Notes (Signed)
PCP:  Hulan Fess, MD Primary Cardiologist: No primary care provider on file. Electrophysiologist: None   ZT:[id number] followup and AVR  Scott [name] is a 78 y.o. male who presents today for routine electrophysiology followup. He reports mild bendopnea for years and occasional peripheral edema over the past 4-6 weeks. He denies orthopnea, PND, SOB with ADLs, dizziness, lightheadedness, or CP. He states he previously had a sleep study that he thinks was negative, and he had surgery to remove his uvula. This seems to have helped his snoring.  He has been eating out 90% of the time under COVID-19 restrictions, and has an affinity for green olives.   Echo 05/31/2019 LVEF 50%  He is s/p bioprosthetic AVR  Last echo 2015 with LVEF 50-55%  History of atrial flutter s/p ablation 2015.      Past Medical History:  Diagnosis Date  . Atherosclerosis   . Atypical atrial flutter ([place])   . Complete heart block (HCC)    s/p PPM following AVR  . Hyperlipidemia   . Hypertensive cardiovascular disease   . Kidney calculus   . Kidney stones   . Obesity   . Valvular heart disease    Past Surgical History:  Procedure Laterality Date  . AORTIC ROOT REPLACEMENT    . AORTIC VALVE REPLACEMENT    . CARDIAC CATHETERIZATION  03/28/2007   OVERALL CARDIAC SIZE AND SILHOUETTE WERE NORMAL. GLOBAL FUNCTION AND REGIONAL WALL WERE NORMAL  . CARDIOVERSION N/A 02/19/2017   Procedure: CARDIOVERSION;  Surgeon: Sanda Klein, MD;  Location: [facility];  Service: Cardiovascular;  Laterality: N/A;  . EP IMPLANTABLE DEVICE N/A 12/25/2016   Procedure: PPM Generator Changeout;  Surgeon: Deboraha Sprang, MD; complete heart block (MDT Adapta L)  . INSERT / REPLACE / REMOVE PACEMAKER  03/2007  . PPM GENERATOR CHANGEOUT N/A 02/28/2018   Procedure: PPM GENERATOR CHANGEOUT;  Surgeon: Deboraha Sprang, MD;  Location: [facility];  Service: Cardiovascular;  Laterality: N/A;    Current Outpatient Medications   Medication Sig Dispense Refill  . Adalimumab (HUMIRA PEN [place]) Inject into the skin every 14 (fourteen) days.    Marland Kitchen aspirin EC 81 MG tablet Take 1 tablet (81 mg total) by mouth daily.    Marland Kitchen atorvastatin (LIPITOR) 20 MG tablet Take 1 tablet by mouth once daily 90 tablet 2  . calcipotriene (DOVONOX) 0.005 % ointment Apply 1 application topically 2 (two) times daily as needed (for rash).     . Cholecalciferol (VITAMIN D) 2000 units CAPS Take 2,000 Units by mouth daily.     . clobetasol cream (TEMOVATE) 3.82 % Apply 1 application topically 2 (two) times daily as needed (for rash).    . fish oil-omega-3 fatty acids 1000 MG capsule Take 1 g by mouth daily.     . metoprolol tartrate (LOPRESSOR) 25 MG tablet TAKE 1 TABLET BY MOUTH TWICE DAILY 180 tablet 3  . ranitidine (ZANTAC) 150 MG tablet Take 150 mg by mouth at bedtime.     . timolol (TIMOPTIC) 0.5 % ophthalmic solution Place 1 drop into the right eye 2 (two) times daily.      No current facility-administered medications for this visit.     Allergies  Allergen Reactions  . Clindamycin/Lincomycin Itching  . Morphine Itching    Social History   Socioeconomic History  . Marital status: Widowed    Spouse name: Not on file  . Number of children: Not on file  . Years of education: Not on file  .  Highest education level: Not on file  Occupational History  . Not on file  Social Needs  . Financial resource strain: Not on file  . Food insecurity    Worry: Not on file    Inability: Not on file  . Transportation needs    Medical: Not on file    Non-medical: Not on file  Tobacco Use  . Smoking status: Former Smoker    Packs/day: 1.50    Years: 30.00    Pack years: 45.00    Types: Cigarettes    Quit date: 10/30/1984    Years since quitting: 34.6  . Smokeless tobacco: Never Used  Substance and Sexual Activity  . Alcohol use: No  . Drug use: No  . Sexual activity: Not on file  Lifestyle  . Physical activity    Days per week: Not on  file    Minutes per session: Not on file  . Stress: Not on file  Relationships  . Social Herbalist on phone: Not on file    Gets together: Not on file    Attends religious service: Not on file    Active member of club or organization: Not on file    Attends meetings of clubs or organizations: Not on file    Relationship status: Not on file  . Intimate partner violence    Fear of current or ex partner: Not on file    Emotionally abused: Not on file    Physically abused: Not on file    Forced sexual activity: Not on file  Other Topics Concern  . Not on file  Social History Narrative  . Not on file   Review of Systems: General: No chills, fever, night sweats or weight changes  Cardiovascular:  No chest pain, dyspnea on exertion, orthopnea, palpitations, paroxysmal nocturnal dyspnea Dermatological: No rash, lesions or masses Respiratory: No cough, dyspnea Urologic: No hematuria, dysuria Abdominal: No nausea, vomiting, diarrhea, bright red blood per rectum, melena, or hematemesis Neurologic: No visual changes, weakness, changes in mental status All other systems reviewed and are otherwise negative except as noted above.  Physical Exam: Vitals:   06/28/19 1107  BP: 118/68  Pulse: 72  SpO2: 95%  Weight: 219 lb 3.2 oz (99.4 kg)  Height: 5\' 10"  (1.778 m)   GEN- The patient is well appearing, alert and oriented x 3 today.   Head- normocephalic, atraumatic Eyes-  Sclera clear, conjunctiva pink Ears- hearing intact Oropharynx- clear Neck- supple,  Lungs- Clear to ausculation bilaterally, normal work of breathing Chest- pacemaker pocket is without hematoma/ bruit Heart- Obese, Regular rate and rhythm, no murmurs, rubs or gallops, PMI not laterally displaced GI- soft, NT, ND, + BS Extremities- no clubbing or cyanosis. He has trace to 1+ ankle edema.  Neuro- strength and sensation are intact  Pacemaker interrogation- reviewed in detail today,  See paper chart    Assessment and Plan:  Complete heart block - Dependent today  Atrial flutter s/p ablation 2015  - No atrial arrhythmias by device interrogation.  Aortic valve Bioprosthesis  VT nonsustained - 2 episodes on device. No associated symptoms. No recent electrolytes. Labs today  Cardiomyopathy  Mild nonischemic - Stable. EF 50% 05/2019  Pacer medtronic on advisory recall  S/p generator replacement 3/19-- original implantation 2008  Obesity - Encouraged diet and exercise. I suspect this is the prevalent driving factor of his bendopnea.   Peripheral edema - Mild. Will check labwork today for completeness, and add lasix 20 mg  to take AS NEEDED.   He wishes to move his follow up to [place]. Dr. Caryl Comes has reached out to a provider there to establish contact.    Shirley Friar, PA-C  06/28/19 11:32 AM   As above  Some edema, will increase diuretic, but check renal funciton and CBC as could be contributory as no neck veins  Will help arrange fu w Cardiology   Reached out to Dr Ricky Ala and gave me the name of Dr Bradd Burner  And passed on the pts info(w permission) and they will be in touch

## 2019-06-28 NOTE — Patient Instructions (Signed)
Medication Instructions:  Your physician has recommended you make the following change in your medication:   1. Take lasix, 20mg  tablet, as needed for swelling.   Labwork: You will have labs drawn today: CBC and BMP   Testing/Procedures: None ordered.  Follow-Up: Your physician recommends that you schedule a follow-up appointment as needed with Dr Caryl Comes.  Dr. Doralee Albino [facility] [address], [place],  [address] Phone: [phone]  Any Other Special Instructions Will Be Listed Below (If Applicable).     If you need a refill on your cardiac medications before your next appointment, please call your pharmacy.

## 2019-06-29 LAB — CBC
Hematocrit: 37.9 % (ref 37.5–51.0)
Hemoglobin: 13 g/dL (ref 13.0–17.7)
MCH: 32.6 pg (ref 26.6–33.0)
MCHC: 34.3 g/dL (ref 31.5–35.7)
MCV: 95 fL (ref 79–97)
Platelets: 154 10*3/uL (ref 150–450)
RBC: 3.99 x10E6/uL — ABNORMAL LOW (ref 4.14–5.80)
RDW: 12.3 % (ref 11.6–15.4)
WBC: 8.3 10*3/uL (ref 3.4–10.8)

## 2019-06-29 LAB — BASIC METABOLIC PANEL
BUN/Creatinine Ratio: 14 (ref 10–24)
BUN: 12 mg/dL (ref 8–27)
CO2: 27 mmol/L (ref 20–29)
Calcium: 9.1 mg/dL (ref 8.6–10.2)
Chloride: 101 mmol/L (ref 96–106)
Creatinine, Ser: 0.87 mg/dL (ref 0.76–1.27)
GFR calc Af Amer: 94 mL/min/{1.73_m2} (ref >59)
GFR calc non Af Amer: 82 mL/min/{1.73_m2} (ref >59)
Glucose: 121 mg/dL — ABNORMAL HIGH (ref 65–99)
Potassium: 4.5 mmol/L (ref 3.5–5.2)
Sodium: 140 mmol/L (ref 134–144)

## 2019-06-29 NOTE — Addendum Note (Signed)
Addended by: Mady Haagensen on: 06/29/2019 03:34 PM   Modules accepted: Orders

## 2019-07-07 ENCOUNTER — Encounter: Payer: Self-pay | Admitting: Cardiology

## 2019-07-07 NOTE — Progress Notes (Signed)
Remote pacemaker transmission.   

## 2019-09-05 ENCOUNTER — Telehealth: Payer: Self-pay

## 2019-09-05 NOTE — Telephone Encounter (Signed)
I spoke with the pt to make sure he wanted to be released to the [facility] and he states it is what he wants. The pt [place] hour away and wants to be closer to home. He thanked me for calling to check with him first. He told me let Dr. Caryl Comes know he appreciate everything he has done for him. The pt thanked me again for the call.

## 2019-09-19 ENCOUNTER — Telehealth: Payer: Self-pay

## 2019-09-26 ENCOUNTER — Encounter: Payer: Medicare Other | Admitting: *Deleted

## 2019-10-17 ENCOUNTER — Telehealth: Payer: Self-pay

## 2019-10-17 NOTE — Telephone Encounter (Signed)
I spoke with the pt and let him know I cancelled all his upcoming Home remote checks and he was transferred to [facility] in September.

## 2019-10-17 NOTE — Telephone Encounter (Signed)
-----   Message from Anson Crofts sent at 10/17/2019  1:58 PM EDT ----- Received a message from Scott [name]. He has relocated to [place], [place]. He stated he is still receiving transmission information. Asked that someone call him about it. [phone]
